# Patient Record
Sex: Male | Born: 1995 | Race: White | Hispanic: No | Marital: Single | Smoking: Never smoker
Health system: Southern US, Community
[De-identification: ages and names within clinical notes are randomized; demographics above are authoritative.]

## PROBLEM LIST (undated history)

## (undated) DIAGNOSIS — G43909 Migraine, unspecified, not intractable, without status migrainosus: Secondary | ICD-10-CM

---

## 2020-12-06 ENCOUNTER — Telehealth: Payer: Self-pay | Admitting: Hematology

## 2020-12-06 ENCOUNTER — Emergency Department (HOSPITAL_COMMUNITY): Payer: BC Managed Care – PPO

## 2020-12-06 ENCOUNTER — Encounter (HOSPITAL_COMMUNITY): Payer: Self-pay

## 2020-12-06 ENCOUNTER — Other Ambulatory Visit (HOSPITAL_COMMUNITY): Payer: Self-pay

## 2020-12-06 ENCOUNTER — Other Ambulatory Visit: Payer: Self-pay

## 2020-12-06 ENCOUNTER — Emergency Department (HOSPITAL_COMMUNITY)
Admission: EM | Admit: 2020-12-06 | Discharge: 2020-12-06 | Disposition: A | Discharge status: Home / Self Care | Payer: BC Managed Care – PPO | Attending: Emergency Medicine | Admitting: Emergency Medicine

## 2020-12-06 DIAGNOSIS — I676 Nonpyogenic thrombosis of intracranial venous system: Secondary | ICD-10-CM | POA: Diagnosis not present

## 2020-12-06 DIAGNOSIS — R519 Headache, unspecified: Secondary | ICD-10-CM

## 2020-12-06 DIAGNOSIS — G08 Intracranial and intraspinal phlebitis and thrombophlebitis: Secondary | ICD-10-CM

## 2020-12-06 HISTORY — DX: Migraine, unspecified, not intractable, without status migrainosus: G43.909

## 2020-12-06 LAB — CBC WITH DIFFERENTIAL/PLATELET
Abs Immature Granulocytes: 0.02 10*3/uL (ref 0.00–0.07)
Basophils Absolute: 0 10*3/uL (ref 0.0–0.1)
Basophils Relative: 0 %
Eosinophils Absolute: 0.1 10*3/uL (ref 0.0–0.5)
Eosinophils Relative: 1 %
HCT: 44.6 % (ref 39.0–52.0)
Hemoglobin: 15.4 g/dL (ref 13.0–17.0)
Immature Granulocytes: 0 %
Lymphocytes Relative: 32 %
Lymphs Abs: 2.3 10*3/uL (ref 0.7–4.0)
MCH: 28.3 pg (ref 26.0–34.0)
MCHC: 34.5 g/dL (ref 30.0–36.0)
MCV: 82 fL (ref 80.0–100.0)
Monocytes Absolute: 0.5 10*3/uL (ref 0.1–1.0)
Monocytes Relative: 7 %
Neutro Abs: 4.4 10*3/uL (ref 1.7–7.7)
Neutrophils Relative %: 60 %
Platelets: 294 10*3/uL (ref 150–400)
RBC: 5.44 MIL/uL (ref 4.22–5.81)
RDW: 13 % (ref 11.5–15.5)
WBC: 7.2 10*3/uL (ref 4.0–10.5)
nRBC: 0 % (ref 0.0–0.2)

## 2020-12-06 LAB — ANTITHROMBIN III: AntiThromb III Func: 123 % — ABNORMAL HIGH (ref 75–120)

## 2020-12-06 LAB — BASIC METABOLIC PANEL
Anion gap: 11 (ref 5–15)
BUN: 9 mg/dL (ref 6–20)
CO2: 23 mmol/L (ref 22–32)
Calcium: 9.9 mg/dL (ref 8.9–10.3)
Chloride: 105 mmol/L (ref 98–111)
Creatinine, Ser: 0.84 mg/dL (ref 0.61–1.24)
GFR, Estimated: >60 mL/min (ref >60)
Glucose, Bld: 107 mg/dL — ABNORMAL HIGH (ref 70–99)
Potassium: 3.7 mmol/L (ref 3.5–5.1)
Sodium: 139 mmol/L (ref 135–145)

## 2020-12-06 MED ORDER — RIVAROXABAN 20 MG PO TABS: 20.0000 mg | ORAL_TABLET | Freq: Every day | ORAL | 1 refills | Status: DC

## 2020-12-06 MED ORDER — METOCLOPRAMIDE HCL 5 MG/ML IJ SOLN
10.0000 mg | Freq: Once | INTRAMUSCULAR | Status: AC
  Administered 2020-12-06: 10 mg via INTRAVENOUS
  Filled 2020-12-06: qty 2

## 2020-12-06 MED ORDER — DEXAMETHASONE SODIUM PHOSPHATE 10 MG/ML IJ SOLN
10.0000 mg | Freq: Once | INTRAMUSCULAR | Status: AC
  Administered 2020-12-06: 10 mg via INTRAVENOUS
  Filled 2020-12-06: qty 1

## 2020-12-06 MED ORDER — ONDANSETRON HCL 4 MG/2ML IJ SOLN
4.0000 mg | Freq: Once | INTRAMUSCULAR | Status: AC
Start: 2020-12-06 — End: 2020-12-06
  Administered 2020-12-06: 4 mg via INTRAVENOUS
  Filled 2020-12-06: qty 2

## 2020-12-06 MED ORDER — GADOBUTROL 1 MMOL/ML IV SOLN
7.5000 mL | Freq: Once | INTRAVENOUS | Status: AC | PRN
  Administered 2020-12-06: 7.5 mL via INTRAVENOUS

## 2020-12-06 MED ORDER — IOHEXOL 350 MG/ML SOLN
75.0000 mL | Freq: Once | INTRAVENOUS | Status: AC | PRN
  Administered 2020-12-06: 75 mL via INTRAVENOUS

## 2020-12-06 MED ORDER — SODIUM CHLORIDE 0.9 % IV BOLUS
500.0000 mL | Freq: Once | INTRAVENOUS | Status: AC
  Administered 2020-12-06: 500 mL via INTRAVENOUS

## 2020-12-06 MED ORDER — KETOROLAC TROMETHAMINE 30 MG/ML IJ SOLN
30.0000 mg | Freq: Once | INTRAMUSCULAR | Status: AC
  Administered 2020-12-06: 30 mg via INTRAVENOUS
  Filled 2020-12-06: qty 1

## 2020-12-06 MED ORDER — RIVAROXABAN (XARELTO) VTE STARTER PACK (15 & 20 MG)
ORAL_TABLET | ORAL | 0 refills | Status: DC
  Filled 2020-12-06: qty 51, 30d supply, fill #0

## 2020-12-06 NOTE — ED Provider Notes (Signed)
[facility] EMERGENCY DEPARTMENT Provider Note   CSN: [id number] Arrival date & time: 12/06/20  0019     History Chief Complaint  Patient presents with  . Migraine    Jeremy Mcmahon is a 25 y.o. male.  He is here with a complaint of headaches that have been going on for 2 months.  They occur daily.  Associated with nausea vomiting dizziness lightheadedness.  Radiate into neck.  Currently 7 out of 10 intensity.  Occurs all over the head but primarily left parietal.  No blurry vision double vision numbness or weakness.  Is taking Ubrevly without much improvement.  Is awaiting neurology appointment in July.  The history is provided by the patient.  Migraine This is a recurrent problem. The current episode started yesterday. The problem occurs constantly. The problem has not changed since onset.Associated symptoms include headaches. Pertinent negatives include no chest pain, no abdominal pain and no shortness of breath. Nothing aggravates the symptoms. Nothing relieves the symptoms. He has tried rest for the symptoms. The treatment provided no relief.       Past Medical History:  Diagnosis Date  . Migraines     There are no problems to display for this patient.   History reviewed. No pertinent surgical history.     History reviewed. No pertinent family history.  Social History   Tobacco Use  . Smoking status: Never Smoker  . Smokeless tobacco: Never Used  Vaping Use  . Vaping Use: Never used  Substance Use Topics  . Alcohol use: Yes  . Drug use: Yes    Types: Marijuana    Home Medications Prior to Admission medications   Not on File    Allergies    Patient has no known allergies.  Review of Systems   Review of Systems  Constitutional: Negative for fever.  HENT: Negative for sore throat.   Eyes: Negative for visual disturbance.  Respiratory: Negative for shortness of breath.   Cardiovascular: Negative for chest pain.  Gastrointestinal:  Negative for abdominal pain.  Genitourinary: Negative for dysuria.  Musculoskeletal: Positive for neck pain.  Skin: Negative for rash.  Neurological: Positive for dizziness, light-headedness and headaches. Negative for syncope, speech difficulty, weakness and numbness.    Physical Exam Updated Vital Signs BP 118/83 (BP Location: Right Arm)   Pulse 87   Temp 99 F (37.2 C) (Oral)   Resp 18   SpO2 98%   Physical Exam Vitals and nursing note reviewed.  Constitutional:      Appearance: Normal appearance. He is well-developed.  HENT:     Head: Normocephalic and atraumatic.  Eyes:     Conjunctiva/sclera: Conjunctivae normal.  Cardiovascular:     Rate and Rhythm: Normal rate and regular rhythm.     Heart sounds: No murmur heard.   Pulmonary:     Effort: Pulmonary effort is normal. No respiratory distress.     Breath sounds: Normal breath sounds.  Abdominal:     Palpations: Abdomen is soft.     Tenderness: There is no abdominal tenderness.  Musculoskeletal:        General: No deformity or signs of injury. Normal range of motion.     Cervical back: Neck supple.  Skin:    General: Skin is warm and dry.  Neurological:     General: No focal deficit present.     Mental Status: He is alert and oriented to person, place, and time.     Cranial Nerves: No cranial nerve deficit.  Sensory: No sensory deficit.     Motor: No weakness.     Gait: Gait normal.     ED Results / Procedures / Treatments   Labs (all labs ordered are listed, but only abnormal results are displayed) Labs Reviewed  BASIC METABOLIC PANEL - Abnormal; Notable for the following components:      Result Value   Glucose, Bld 107 (*)    All other components within normal limits  ANTITHROMBIN III - Abnormal; Notable for the following components:   AntiThromb III Func 123 (*)    All other components within normal limits  CBC WITH DIFFERENTIAL/PLATELET  PROTEIN C ACTIVITY  PROTEIN C, TOTAL  PROTEIN S  ACTIVITY  PROTEIN S, TOTAL  LUPUS ANTICOAGULANT PANEL  BETA-2-GLYCOPROTEIN I ABS, IGG/M/A  HOMOCYSTEINE  FACTOR 5 LEIDEN  PROTHROMBIN GENE MUTATION  CARDIOLIPIN ANTIBODIES, IGG, IGM, IGA    EKG None  Radiology CT Head Wo Contrast  Result Date: 12/06/2020 CLINICAL DATA:  Headache EXAM: CT HEAD WITHOUT CONTRAST TECHNIQUE: Contiguous axial images were obtained from the base of the skull through the vertex without intravenous contrast. COMPARISON:  None. FINDINGS: Brain: Normal anatomic configuration. No abnormal intra or extra-axial mass lesion or fluid collection. No abnormal mass effect or midline shift. No evidence of acute intracranial hemorrhage or infarct. Ventricular size is normal. Cerebellum unremarkable. Vascular: Unremarkable Skull: Intact Sinuses/Orbits: Paranasal sinuses are clear. Orbits are unremarkable. Other: Mastoid air cells and middle ear cavities are clear. IMPRESSION: No acute intracranial abnormality.  Normal examination. Electronically Signed   By: Helyn Numbers MD   On: 12/06/2020 01:43   MR Brain W and Wo Contrast  Result Date: 12/06/2020 CLINICAL DATA:  Headache EXAM: MRI HEAD WITHOUT AND WITH CONTRAST TECHNIQUE: Multiplanar, multiecho pulse sequences of the brain and surrounding structures were obtained without and with intravenous contrast. CONTRAST:  7.75mL GADAVIST GADOBUTROL 1 MMOL/ML IV SOLN COMPARISON:  None. FINDINGS: Brain: There is no acute infarction or intracranial hemorrhage. There is no intracranial mass, mass effect, or edema. There is no hydrocephalus or extra-axial fluid collection. Ventricles and sulci are normal in size and configuration. No abnormal enhancement. Vascular: Major vessel flow voids at the skull base are preserved. Skull and upper cervical spine: Normal marrow signal is preserved. Sinuses/Orbits: Minor mucosal thickening.  Orbits are unremarkable. Other: Sella is unremarkable.  Mastoid air cells are clear. IMPRESSION: No intracranial mass,  abnormal enhancement, or other significant abnormality. Electronically Signed   By: Guadlupe Spanish M.D.   On: 12/06/2020 11:35   CT VENOGRAM HEAD  Result Date: 12/06/2020 CLINICAL DATA:  Dural venous sinus thrombosis suspected. EXAM: CT VENOGRAM HEAD TECHNIQUE: Contiguous axial images were obtained from the base of the skull through the vertex during the bolus administration of intravenous contrast using CTV timing. Multiplanar CT image reconstructions and MIPs were obtained to evaluate the venous anatomy. CONTRAST:  47mL OMNIPAQUE IOHEXOL 350 MG/ML SOLN COMPARISON:  Same day CT head and MRI FINDINGS: Linear, nonocclusive filling defects within the superior sagittal sinus. The superior sagittal sinus is not expanded. No evidence of thrombosis in the transverse, sigmoid, and straight sinuses. No evidence of deep vein thrombosis. Symmetric opacification of the cavernous sinuses. Please see same day MRI and CT head for extravascular intracranial evaluation. 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 Findings discussed with Dr. Charm Barges via telephone at 12:18 PM via telephone. Electronically Signed   By: Feliberto Harts MD   On: 12/06/2020 12:25  Procedures Procedures   Medications Ordered in ED Medications  sodium chloride 0.9 % bolus 500 mL (0 mLs Intravenous Stopped 12/06/20 1014)  ketorolac (TORADOL) 30 MG/ML injection 30 mg (30 mg Intravenous Given 12/06/20 0827)  metoCLOPramide (REGLAN) injection 10 mg (10 mg Intravenous Given 12/06/20 0825)  dexamethasone (DECADRON) injection 10 mg (10 mg Intravenous Given 12/06/20 0826)  ondansetron (ZOFRAN) injection 4 mg (4 mg Intravenous Given 12/06/20 1031)  gadobutrol (GADAVIST) 1 MMOL/ML injection 7.5 mL (7.5 mLs Intravenous Contrast Given 12/06/20 1115)  iohexol (OMNIPAQUE) 350 MG/ML injection 75 mL (75 mLs Intravenous Contrast Given 12/06/20 1155)    ED Course  I have  reviewed the triage vital signs and the nursing notes.  Pertinent labs & imaging results that were available during my care of the patient were reviewed by me and considered in my medical decision making (see chart for details).  Clinical Course as of 12/06/20 2045  Wed Dec 06, 2020  1022 Patient felt somewhat better after migraine cocktail but now headache is returned after ambulating.  Reviewed case with Dr. Selina Cooley neurology who recommends a CTV and MRI with and without.  Patient agreeable to plan. [MB]  1151 Patient was seen by Dr. Selina Cooley and she is recommending if the patient's CTV is negative that he stop his current migraine medications and start on Nurtec 75 q. OD and add topiramate 50 nightly if not improved after 1 week. [MB]  1220 Discussed with Dr. Candise Che -he felt that the patient could appropriately just be started on a DOAC.  He did agree that he should have a hyper coag work-up due to this unprovoked event.  He felt that he can follow-up in the oncology clinic if needed. [MB]    Clinical Course User Index [MB] Terrilee Files, MD   MDM Rules/Calculators/A&P                         This patient complains of daily headache x2 months; this involves an extensive number of treatment Options and is a complaint that carries with it a high risk of complications and Morbidity. The differential includes migraine, tension headache, tumor, bleed, mass, CVT  I ordered, reviewed and interpreted labs, which included CBC with normal white count normal hemoglobin, chemistries normal other than mildly elevated glucose, hypercoagulable work-up I ordered medication IV fluids, Toradol, Reglan, Decadron with slight improvement in his symptoms I ordered imaging studies which included CT head, CTV and MRI with and without and I independently    visualized and interpreted imaging which showed probable CVT nonocclusive Additional history obtained from patient's wife Previous records obtained and reviewed  in epic, no recent admissions I consulted Dr. Selina Cooley neurology and Dr. Candise Che hematology and discussed lab and imaging findings  Critical Interventions: None  After the interventions stated above, I reevaluated the patient and found patient still to be minimally symptomatic.  I have explained anticipated course and treatment.  He is agreeable to start on anticoagulation.  We will put outpatient referrals for hematology and neurology.  Return instructions discussed   Final Clinical Impression(s) / ED Diagnoses Final diagnoses:  Worsening headaches  Cerebral venous thrombosis    Rx / DC Orders ED Discharge Orders         Ordered    Ambulatory referral to Neurology       Comments: An appointment is requested in approximately: 4 weeks   12/06/20 1151    Ambulatory referral to Hematology / Oncology  12/06/20 1435    Ambulatory referral to Hematology / Oncology       Comments: Cerebral venous thrombosis - discussed with Dr Candise CheKale   12/06/20 1513    RIVAROXABAN (XARELTO) VTE STARTER PACK (15 & 20 MG)        12/06/20 1517    rivaroxaban (XARELTO) 20 MG TABS tablet  Daily with supper,   Status:  Discontinued        12/06/20 1519    rivaroxaban (XARELTO) 20 MG TABS tablet  Daily with supper        12/06/20 1527           Terrilee FilesButler, Lavonne C, MD 12/06/20 2048

## 2020-12-06 NOTE — ED Provider Notes (Signed)
  Emergency Medicine Provider in Triage Note   MSE was initiated and I personally evaluated the patient  1:11 AM on December 06, 2020 as provider in triage.   Chief Complaint: Headache  HPI  Patient is a 25 y.o. who presets to the ED with complaints of headache. Intermittent headache and neck pain x 2 months, almost daily, associated with nausea, vomiting, & dizziness. Gradual onset, steady progression. Discomfort worse tonight prompting ED visit. Has seen neurology. Taking as needed migraine medicine without relief. Would like some type of imaging as he has not had any.    Review of Systems  Positive: Headache, dizzy, nausea, vomiting Negative: Visual disturbance, numbness, or weakness.   Physical Exam  BP 128/83   Pulse 92   Temp 99 F (37.2 C) (Oral)   Resp 16   SpO2 100%    Gen:   Awake, no distress   HEENT:  Atraumatic  Resp:  Normal effort  Cardiac:  Normal rate  Abd:   Nondistended, nontender  MSK:   Moves extremities without difficulty  Neuro:  Speech clear, CN III-XII Grossly intact. Sensation grossly intact x 4. 5/5 symmetric grip strength & plantar/dorsiflexion strength. Intact finger to nose. Negative pronator drift.  Medical Decision Making   Initiation of care has begun. The patient has been counseled on the process, plan, and necessity for staying for the completion/evaluation, informed that the remainder of the evaluation will be completed by another provider, this initial triage assessment does not replace that evaluation, and the importance of remaining in the ED until their evaluation is complete.   Clinical Impression  Headache        Cherly Anderson, PA-C 12/06/20 7026    Shon Baton, MD 12/06/20 [phone]

## 2020-12-06 NOTE — ED Notes (Signed)
Pt was given scan images from CT & MRI on a disk at his request.

## 2020-12-06 NOTE — ED Triage Notes (Signed)
Pt c/o migraine with vomiting for several months. Pt states he is unable to function due to the pain and vomiting.

## 2020-12-06 NOTE — ED Notes (Signed)
Patient transported to MRI 

## 2020-12-06 NOTE — Discharge Instructions (Addendum)
You were seen in the emergency department for a headache.  You had a CAT scan of your head and an MRI.  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  The treatment for this is blood thinners.  We are putting referrals in for outpatient follow-up with our oncology and neurology groups.  Please return to the emergency department if any acute neurologic changes or uncontrolled bleeding.  Information on my medicine - XARELTO (rivaroxaban)  WHY WAS XARELTO PRESCRIBED FOR YOU? Xarelto was prescribed to treat blood clots that may have been found in the veins of your legs (deep vein thrombosis) or in your lungs (pulmonary embolism) and to reduce the risk of them occurring again.  What do you need to know about Xarelto? The starting dose is one 15 mg tablet taken TWICE daily with food for the FIRST 21 DAYS then the dose is changed to one 20 mg tablet taken ONCE A DAY with your evening meal.   DO NOT stop taking Xarelto without talking to the health care provider who prescribed the medication.  Refill your prescription for 20 mg tablets before you run out.  After discharge, you should have regular check-up appointments with your healthcare provider that is prescribing your Xarelto.  In the future your dose may need to be changed if your kidney function changes by a significant amount.  What do you do if you miss a dose? If you are taking Xarelto TWICE DAILY and you miss a dose, take it as soon as you remember. You may take two 15 mg tablets (total 30 mg) at the same time then resume your regularly scheduled 15 mg twice daily the next day.  If you are taking Xarelto ONCE DAILY and you miss a dose, take it as soon as you remember on the same day then continue your regularly scheduled once daily regimen the next day. Do not take two doses of Xarelto at the same time.   Important Safety Information Xarelto is a blood thinner medicine that can  cause bleeding. You should call your healthcare provider right away if you experience any of the following: ? Bleeding from an injury or your nose that does not stop. ? Unusual colored urine (red or dark brown) or unusual colored stools (red or black). ? Unusual bruising for unknown reasons. ? A serious fall or if you hit your head (even if there is no bleeding).  Some medicines may interact with Xarelto and might increase your risk of bleeding while on Xarelto. To help avoid this, consult your healthcare provider or pharmacist prior to using any new prescription or non-prescription medications, including herbals, vitamins, non-steroidal anti-inflammatory drugs (NSAIDs) and supplements.  This website has more information on Xarelto: VisitDestination.com.br.

## 2020-12-06 NOTE — Telephone Encounter (Signed)
Received a new hem referral from the [facility] ED for Cerebral venous thrombosis. Mr. Fouch has been scheduled to see Dr. Candise Che on 5/12 at 1pm. Pt is currently in the hospital. Letter mailed.

## 2020-12-07 LAB — LUPUS ANTICOAGULANT PANEL
DRVVT: 33.6 s (ref 0.0–47.0)
PTT Lupus Anticoagulant: 38 s (ref 0.0–51.9)

## 2020-12-07 LAB — BETA-2-GLYCOPROTEIN I ABS, IGG/M/A
Beta-2 Glyco I IgG: <9 GPI IgG units (ref 0–20)
Beta-2-Glycoprotein I IgA: <9 GPI IgA units (ref 0–25)
Beta-2-Glycoprotein I IgM: <9 GPI IgM units (ref 0–32)

## 2020-12-07 LAB — CARDIOLIPIN ANTIBODIES, IGG, IGM, IGA
Anticardiolipin IgA: <9 APL U/mL (ref 0–11)
Anticardiolipin IgG: <9 GPL U/mL (ref 0–14)
Anticardiolipin IgM: 13 MPL U/mL — ABNORMAL HIGH (ref 0–12)

## 2020-12-07 LAB — PROTEIN C, TOTAL: Protein C, Total: 135 % (ref 60–150)

## 2020-12-07 LAB — PROTEIN S ACTIVITY: Protein S Activity: 61 % — ABNORMAL LOW (ref 63–140)

## 2020-12-07 LAB — PROTEIN C ACTIVITY: Protein C Activity: 127 % (ref 73–180)

## 2020-12-07 LAB — PROTEIN S, TOTAL: Protein S Ag, Total: 72 % (ref 60–150)

## 2020-12-11 ENCOUNTER — Telehealth (HOSPITAL_COMMUNITY): Payer: Self-pay | Admitting: Pharmacist

## 2020-12-11 ENCOUNTER — Telehealth: Payer: Self-pay | Admitting: Hematology

## 2020-12-11 NOTE — Telephone Encounter (Signed)
Pharmacy Transitions of Care Follow-up Telephone Call  Date of discharge: 12/06/2020  How have you been since you were released from the hospital? Good  Medication changes made at discharge:  - START: Xarelto  Medication changes verified by the patient? Yes   Medication Accessibility:  Was the patient provided with refills on discharged medications? NO  Have all prescriptions been transferred from [facility] to home pharmacy? N/A  . Is the patient able to afford medications? Yes    Medication Review:  RIVAROXABAN (XARELTO)  Rivaroxaban 15 mg BID initiated on 12/06/20 Will switch to 20 mg daily after 21 days.   - Discussed importance of taking medication with food and around the same time everyday  - Reviewed potential DDIs with patient  - Advised patient of medications to avoid (NSAIDs, ASA)  - Educated that Tylenol (acetaminophen) will be the preferred analgesic to prevent risk of bleeding  - Emphasized importance of monitoring for signs and symptoms of bleeding (abnormal bruising, prolonged bleeding, nose bleeds, bleeding from gums, discolored urine, black tarry stools)  - Advised patient to alert all providers of anticoagulation therapy prior to starting a new medication or having a procedure    Follow-up Appointments:   [facility]t [facility]? Hematology/oncology Scheduled to see 12/14/20, Neurology mid-june  If their condition worsens, is the pt aware to call PCP or go to the Emergency Dept.? Yes  Final Patient Assessment: Reviewed possible side effects associated with Xarelto.  Patient states he is feeling good but has noticed some numbness in arm, he is calling neurology back today to speak with them about this concern.

## 2020-12-11 NOTE — Telephone Encounter (Signed)
Pt cld to reschedule his new pt appt w/Dr. Candise Che to 5/5 at 11am. Awar to arrive 20 minutes early.

## 2020-12-12 LAB — FACTOR 5 LEIDEN

## 2020-12-13 LAB — PROTHROMBIN GENE MUTATION

## 2020-12-13 NOTE — Progress Notes (Signed)
HEMATOLOGY/ONCOLOGY CONSULTATION NOTE  Date of Service: 12/14/2020  Patient Care Team: Normajean Glasgow, FNP as PCP - General (Family Medicine)  CHIEF COMPLAINTS/PURPOSE OF CONSULTATION:  Cerebral Venous Thrombosis  HISTORY OF PRESENTING ILLNESS:   Jeremy Mcmahon is a wonderful 25 y.o. male who has been referred to [place] by [facility] ED for evaluation and management of cerebral venous thrombosis. The pt reports that he is doing well overall. We are joined today by the pt's wife.  The pt presented in the ED on 12/06/2020 with complaints of daily migraines that have been ongoing for two months and associated nausea, vomiting, dizziness, and lightheadedness. The pt had a CT Head wo contrast that revealed "No acute intracranial abnormality.  Normal examination" . The pt also had a CT venogram head that revealed "Mild, linear, nonocclusive filling defects within a non-expanded superior sagittal sinus, possibly chronic thrombus." The pt had a MRI Brain that revealed  "No intracranial mass, abnormal enhancement, or other significant abnormality." The pt was referred here and to neurology. The pt's appointment with neurology is scheduled in one month, for 01/23/2021.  The pt notes that he has never seen a neurologist in the past for his hx of migraines. The pt notes that he has been very healthy in the past with no hospitalizations, surgeries, or chronic medical issues. He denies any allergies or chronic use of medicine. The pt notes that he has been cycling through different antidepressants for around 2-3 months. The pt notes he previously tried Lexapro, Cirtraline, and Prolex. The pt is currently on   The pt notes that a few months ago he was first experiencing chronic neck pains in the left side. The pt was seen for this and received Naproxen. This resolved until the pt started experiencing chronic headaches that were on both sides of his head and concentrated on the back left side. The pt notes that sometimes  it felt as though he had a tight band on his head. The pt notes the headaches started after antidepressants, but had been on these medicines before with no issues. The pt notes he went to his PCP and they gave him Ubrelvy for one week. The pt notes this helped him, but he was instructed to stop by the neurologist in the ED. The pt notes he went to the ED that night due to the migraines causing nausea and uncontrollable shaking. The pt notes they were not concerned with seizures and the doctor told the pt that the pt's brain was trying to tell him something. The pt's wife notes some issues related to memory loss, mood swings, and paranoia. The pt notes he experiences intermittent issues with his right hand losing all sensation and movement. He notes this is an extreme weakness from the wrist up that lasts a few minutes prior to coming back. The pt notes no history of blood clots and no familial history. The pt notes his mom's first cousin recently passed away of a blood clot issue. The pt denies any close-knit family with issues related to blood clots.  The pt notes that recently he has also been experiencing dizziness and a feeling of imbalance with quick and sudden movements. The pt notes his head feels like a "fish bowl" and "wishy washy". The pt notes he jogged the other day across his house and felt that after he needed to sit down the rest of the day.  The pt notes that he experienced head trauma and syncope in the 4th grade due  to a fall on the playground. The pt notes that he had a workup done for this and CT and noted no issues related to this event.  The pt notes he currently sees Dr. Ludger Nuttingevon, a psychologist in [place] for his depression. 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   Lab results 12/06/2020 of CBC w/diff and BMP is as follows: all values are WNL except  for Glucose of 107. 12/06/2020 Anticardiolipin IgG of <9, IgM of 13, IgA of <9. 12/06/2020 Prothrombin gene mutation not detected. 12/06/2020 Factor 5 leiden not detected. 12/06/2020 Beta-2 Glyco IgG / IgA / IgM of <9. 12/06/2020 Lupus Anticoagulant not detected. 12/06/2020 Protein S Total of 72, Activity of 61. 12/06/2020 Protein C Total of 135, Activity of 127. 12/06/2020 AntiThromb III Func of 123.  On review of systems, pt reports migraines, depression, decreased appetite, neck pain and denies fevers, chills, night sweats, sudden weight loss, leg swelling, changes in bowel habits, bug bites, severe dehydration, exotic travels, and any other symptoms.  MEDICAL HISTORY:  Past Medical History:  Diagnosis Date  . Migraines     SURGICAL HISTORY: No past surgical history on file.  SOCIAL HISTORY: Social History   Socioeconomic History  . Marital status: Single    Spouse name: Not on file  . Number of children: Not on file  . Years of education: Not on file  . Highest education level: Not on file  Occupational History  . Not on file  Tobacco Use  . Smoking status: Never Smoker  . Smokeless tobacco: Never Used  Vaping Use  . Vaping Use: Never used  Substance and Sexual Activity  . Alcohol use: Yes  . Drug use: Yes    Types: Marijuana  . Sexual activity: Not on file  Other Topics Concern  . Not on file  Social History Narrative  . Not on file   Social Determinants of Health   Financial Resource Strain: Not on file  Food Insecurity: Not on file  Transportation Needs: Not on file  Physical Activity: Not on file  Stress: Not on file  Social Connections: Not on file  Intimate Partner Violence: Not on file    FAMILY HISTORY: No family history on file.  ALLERGIES:  has No Known Allergies.  MEDICATIONS:  Current Outpatient Medications  Medication Sig Dispense Refill  . rivaroxaban (XARELTO) 20 MG TABS tablet Take 1 tablet (20 mg total) by mouth daily with supper.  30 tablet 1  . RIVAROXABAN (XARELTO) VTE STARTER PACK (15 & 20 MG) Follow package directions: Take one 15mg  tablet by mouth twice a day. On day 22, switch to one 20mg  tablet once a day. Take with food. 51 each 0   No current facility-administered medications for this visit.    REVIEW OF SYSTEMS:   10 Point review of Systems was done is negative except as noted above.  PHYSICAL EXAMINATION: ECOG PERFORMANCE STATUS: 1 - Symptomatic but completely ambulatory  . Vitals:   12/14/20 1127  BP: 116/72  Pulse: 62  Resp: 15  Temp: 97.6 F (36.4 C)  SpO2: 100%   Filed Weights   12/14/20 1127  Weight: 144 lb (65.3 kg)   .Body mass index is 19 kg/m.   GENERAL:alert, in no acute distress and comfortable SKIN: no acute rashes, no significant lesions EYES: conjunctiva are pink and non-injected, sclera anicteric  OROPHARYNX: MMM, no exudates, no oropharyngeal erythema or ulceration NECK: supple, no JVD LYMPH:  no palpable lymphadenopathy in the cervical, axillary or inguinal regions LUNGS: clear to auscultation b/l with normal respiratory effort HEART: regular rate & rhythm ABDOMEN:  normoactive bowel sounds , non tender, not distended. Extremity: no pedal edema PSYCH: alert & oriented x 3 with fluent speech NEURO: no focal motor/sensory deficits  LABORATORY DATA:  I have reviewed the data as listed  . CBC Latest Ref Rng & Units 12/06/2020  WBC 4.0 - 10.5 K/uL 7.2  Hemoglobin 13.0 - 17.0 g/dL 25.3  Hematocrit 66.4 - 52.0 % 44.6  Platelets 150 - 400 K/uL 294    . CMP Latest Ref Rng & Units 12/06/2020  Glucose 70 - 99 mg/dL 403(K)  BUN 6 - 20 mg/dL 9  Creatinine 7.42 - 5.95 mg/dL 6.38  Sodium 756 - 433 mmol/L 139  Potassium 3.5 - 5.1 mmol/L 3.7  Chloride 98 - 111 mmol/L 105  CO2 22 - 32 mmol/L 23  Calcium 8.9 - 10.3 mg/dL 9.9     RADIOGRAPHIC STUDIES: I have personally reviewed the radiological images as listed and agreed with the findings in the report. CT Head Wo  Contrast  Result Date: 12/06/2020 CLINICAL DATA:  Headache EXAM: CT HEAD WITHOUT CONTRAST TECHNIQUE: Contiguous axial images were obtained from the base of the skull through the vertex without intravenous contrast. COMPARISON:  None. FINDINGS: Brain: Normal anatomic configuration. No abnormal intra or extra-axial mass lesion or fluid collection. No abnormal mass effect or midline shift. No evidence of acute intracranial hemorrhage or infarct. Ventricular size is normal. Cerebellum unremarkable. Vascular: Unremarkable Skull: Intact Sinuses/Orbits: Paranasal sinuses are clear. Orbits are unremarkable. Other: Mastoid air cells and middle ear cavities are clear. IMPRESSION: No acute intracranial abnormality.  Normal examination. Electronically Signed   By: Helyn Numbers MD   On: 12/06/2020 01:43   MR Brain W and Wo Contrast  Result Date: 12/06/2020 CLINICAL DATA:  Headache EXAM: MRI HEAD WITHOUT AND WITH CONTRAST TECHNIQUE: Multiplanar, multiecho pulse sequences of the brain and surrounding structures were obtained without and with intravenous contrast. CONTRAST:  7.108mL GADAVIST GADOBUTROL 1 MMOL/ML IV SOLN COMPARISON:  None. FINDINGS: Brain: There is no acute infarction or intracranial hemorrhage. There is no intracranial mass, mass effect, or edema. There is no hydrocephalus or extra-axial fluid collection. Ventricles and sulci are normal in size and configuration. No abnormal enhancement. Vascular: Major vessel flow voids at the skull base are preserved. Skull and upper cervical spine: Normal marrow signal is preserved. Sinuses/Orbits: Minor mucosal thickening.  Orbits are unremarkable. Other: Sella is unremarkable.  Mastoid air cells are clear. IMPRESSION: No intracranial mass, abnormal enhancement, or other significant abnormality. Electronically Signed   By: Guadlupe Spanish M.D.   On: 12/06/2020 11:35   CT VENOGRAM HEAD  Result Date: 12/06/2020 CLINICAL DATA:  Dural venous sinus thrombosis suspected.  EXAM: CT VENOGRAM HEAD TECHNIQUE: Contiguous axial images were obtained from the base of the skull through the vertex during the bolus administration of intravenous contrast using CTV timing. Multiplanar CT image reconstructions and MIPs were obtained to evaluate the venous anatomy. CONTRAST:  53mL OMNIPAQUE IOHEXOL 350 MG/ML SOLN COMPARISON:  Same day CT head and MRI FINDINGS: Linear, nonocclusive filling defects within the superior sagittal sinus. The superior sagittal sinus is not expanded. No evidence of thrombosis in the transverse, sigmoid, and straight sinuses. No evidence of deep vein thrombosis. Symmetric opacification of the cavernous sinuses. Please see same day MRI  and CT head for extravascular intracranial evaluation. 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 Findings discussed with Dr. Charm Barges via telephone at 12:18 PM via telephone. 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 There were no signs of an acute clot. -Discussed pt labwork 12/06/2020-- no findings suggestive of an inherited nor acquired clotting disorder. Blood counts and chemistries normal. No protein S or C deficiencies.  -Advised pt that the borderline levels on labs that can slightly fluctuate and can be borderline if food intake irregular/ imbalanced. -Discussed common causes for cerebral clots-- genetic or acquired clotting disorders or trauma. The flow in blood vessels abnormal, cells abnormal, or the lining is abnormal. -Recommended pt f/u w neurologist regarding if this a blood clot on findings and if so, if this could explain the pt's symptoms. -Advised pt that it would be extremely rare and extremely unlikely that the COVID vaccine  would cause a clot or these symptoms.  -Recommended pt discuss his right hand loss of function and sensation with neurologist. -Discussed blood thinner management and duration. Will need neurologist opinion prior to decision. -Will re-evaluate indeterminate findings in 3 months with repeat labs. -Recommended pt f/u w PCP regarding need for pain medication and for short term disability from work. -Continue Xarelto for 6 months.and then ASA 81mg  po daily long term -Will see back in 3 months with labs 1 week prior.  FOLLOW UP: RTC with Dr in 3 months Labs 1 week prior to clinic visit   All of the patients questions were answered with apparent satisfaction. The patient knows to call the clinic with any problems, questions or concerns.  I spent 40 minutes counseling the patient face to face. The total time spent in the appointment was 60 minutes and more than 50% was on counseling and direct patient cares.    Candise Che MD MS AAHIVMS [facility] Hematology/Oncology Physician [facility]  (Office):       [phone] (Work cell):  [phone] (Fax):           [phone]  12/14/2020 12:38 PM  I, 02/13/2021, am acting as scribe for Dr. Minda Meo, MD.  .I have reviewed the above documentation for accuracy and completeness, and I agree with the above. Wyvonnia Lora MD

## 2020-12-14 ENCOUNTER — Inpatient Hospital Stay: Payer: BC Managed Care – PPO | Attending: Hematology | Admitting: Hematology

## 2020-12-14 ENCOUNTER — Other Ambulatory Visit: Payer: Self-pay

## 2020-12-14 VITALS — BP 116/72 | HR 62 | Temp 97.6°F | Resp 15 | Ht 73.0 in | Wt 144.0 lb

## 2020-12-14 DIAGNOSIS — Z7901 Long term (current) use of anticoagulants: Secondary | ICD-10-CM | POA: Diagnosis not present

## 2020-12-14 DIAGNOSIS — F32A Depression, unspecified: Secondary | ICD-10-CM | POA: Insufficient documentation

## 2020-12-14 DIAGNOSIS — I676 Nonpyogenic thrombosis of intracranial venous system: Secondary | ICD-10-CM | POA: Insufficient documentation

## 2020-12-14 DIAGNOSIS — R519 Headache, unspecified: Secondary | ICD-10-CM | POA: Diagnosis not present

## 2020-12-14 DIAGNOSIS — G08 Intracranial and intraspinal phlebitis and thrombophlebitis: Secondary | ICD-10-CM

## 2020-12-15 ENCOUNTER — Telehealth: Payer: Self-pay | Admitting: Hematology

## 2020-12-15 NOTE — Telephone Encounter (Signed)
Scheduled follow-up appointments per 5/5 los. Patient is aware. 

## 2020-12-20 ENCOUNTER — Encounter: Payer: Self-pay | Admitting: Neurology

## 2020-12-20 ENCOUNTER — Telehealth: Payer: Self-pay | Admitting: Neurology

## 2020-12-20 ENCOUNTER — Ambulatory Visit: Payer: BC Managed Care – PPO | Admitting: Neurology

## 2020-12-20 VITALS — BP 125/75 | HR 101 | Ht 73.0 in | Wt 146.0 lb

## 2020-12-20 DIAGNOSIS — G08 Intracranial and intraspinal phlebitis and thrombophlebitis: Secondary | ICD-10-CM

## 2020-12-20 DIAGNOSIS — G4486 Cervicogenic headache: Secondary | ICD-10-CM | POA: Diagnosis not present

## 2020-12-20 DIAGNOSIS — M542 Cervicalgia: Secondary | ICD-10-CM

## 2020-12-20 DIAGNOSIS — R519 Headache, unspecified: Secondary | ICD-10-CM

## 2020-12-20 MED ORDER — GABAPENTIN 100 MG PO CAPS: 100.0000 mg | ORAL_CAPSULE | Freq: Every day | ORAL | 3 refills | Status: AC

## 2020-12-20 MED ORDER — AMITRIPTYLINE HCL 25 MG PO TABS: 50.0000 mg | ORAL_TABLET | Freq: Every day | ORAL | 3 refills | Status: DC

## 2020-12-20 NOTE — Addendum Note (Signed)
Addended by: Huston Foley on: 12/20/2020 03:25 PM   Modules accepted: Orders

## 2020-12-20 NOTE — Patient Instructions (Addendum)
It was nice to meet you today.  Your neurological exam is normal thankfully.  Nevertheless, I have several suggestions, I would like to proceed with neck MRI with and without contrast since you have chronic neck pain for the past 3 months.  Please schedule an eye examination as you have not seen an eye specialist in over 2 years and you have prescription eyeglasses which may need updating.  Continue follow-up with hematology and ask them how long you should be on Xarelto, we can consider a repeat brain scan to look at the evolution of your venous blood clot in 3 months.  Your headaches may be a combination of tension headaches, migraine headaches, also neck related headaches.  For headache prevention I recommend Elavil (generic name: amitriptyline) 25 mg: Take half a pill daily at bedtime for one week, then one pill daily at bedtime for one week, then one and a half pills daily at bedtime for one week, then 2 pills daily at bedtime thereafter. Common side effects reported are: mouth dryness, drowsiness, confusion, dizziness.   Please try to hydrate well with water, 6 to 8 cups/day are generally recommended, 8 ounce size each.  Avoid excessive caffeine, you may limit yourself to 1 serving or 2 servings per day, avoid alcohol altogether and avoid illicit drugs including smoking marijuana.

## 2020-12-20 NOTE — Telephone Encounter (Signed)
I called the pt and left vm advising of this change and interaction. Pt was advised to CB if he had any questions.  Called Dasia and advised to D/c the amitriptyline med and fill the gabapentin 100 mg. She verbalized understanding and advised she recently spoke to the pt on this as well.

## 2020-12-20 NOTE — Telephone Encounter (Signed)
I returned Dasia from [facility] call. She sts a notice came through for this pt.  She sts the Wellbutrin 150 mg and the Amitriptyline have an interaction and has a risk for seizure activity. Wanted to know if med should be filled.   I advised I would check with Dr. Frances Furbish and CB, advised to put order on hold for now until I can confirm with the provider.

## 2020-12-20 NOTE — Telephone Encounter (Signed)
[facility] @[id number] - [place], [place] - [address] has called about a possible drug interaction between amitriptyline (ELAVIL) 25 MG tablet & buPROPion (WELLBUTRIN XL) 150 MG 24 hr tablet please call.

## 2020-12-20 NOTE — Progress Notes (Signed)
Subjective:    Patient ID: Jeremy Mcmahon is a 25 y.o. male.  HPI     Huston FoleySaima Pocahontas Cohenour, MD, PhD [facility] [address], [address] [address] [place], [place] [address]  Dear Camelia EngMicah,   I saw your patient, Jeremy Mcmahon, upon your kind request, in my Neurologic clinic today for initial consultation of his migraine headaches.  The patient is unaccompanied today.  As you know, Jeremy Mcmahon is a 25 year old right-handed gentleman with an underlying medical history of migraine headaches, anxiety, depression, recent diagnosis of probable cerebral venous thrombosis, on Xarelto, who reports a 6886-month history of recurrent headaches, headaches tend to be on the bitemporal areas or left parietal area, sometimes based on the neck and radiating upwards.  Sometimes the headache is throbbing.  He has had a longer standing history of neck pain of over 3 months duration.  He has not seen anybody for his neck pain but did go to urgent care in February.  He was given naproxen and was advised to do stretching exercises.  He has been tried on [place]rel[place] but when he went to the emergency room he was advised to stop it.  He has tried over-the-counter medication for headaches.  He has some associated nausea and vomiting, occasional light sensitivity, occasional sound sensitivity and also reports history of vertigo. He works as a Water quality scientistphlebotomist.  He is a non-smoker and does not drink alcohol, has had marijuana before but not in the past few months.  He drinks caffeine occasionally in the form of soda, not daily.  He tries to hydrate well.  He has no family history of migraines or clotting disorders with the exception of 1 first cousin of his mother, this cousin passed away from complications of a blood clot, he has no additional information.  I reviewed your office note from 12/01/2019.  He presented to the emergency room on 12/06/2020 with a 8886-month history of recurrent headaches.  He reported associated nausea and  vomiting as well as lightheadedness.  He was on Ubrelvy at the time without obvious improvement.  I reviewed the emergency room records.  He was treated symptomatically with dexamethasone, ketorolac, metoclopramide, ondansetron.  He had a CT head without contrast on 12/06/2020 and I reviewed the results:  IMPRESSION: No acute intracranial abnormality.  Normal examination.  He had a brain MRI with and without contrast on 12/06/2020 and I reviewed the results:  IMPRESSION: No intracranial mass, abnormal enhancement, or other significant abnormality.  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  Blood work included hypercoagulable labs, protein S activity with slightly decreased percentage at 61 with normal range at 63 to 140%.  Anticardiolipin IgM was elevated at about 13, Antithrombin III was 123% with normal range of 75 to 120%.  He reports loss of appetite in the past few months.  He has had some weight fluctuations, he is not sure what blood work additional blood work he has had through your office, no recent blood work was included in the office note from his visit from 11/30/2020.  For his anxiety and depression he has been on different medications in the past and recalls taking Lexapro, Prozac and sertraline.  For the past 2 weeks he has been on Wellbutrin, long-acting, 150 mg once daily.  He sees a Therapist, sportspsychiatrist.  His Past Medical History Is Significant For: Past Medical  History:  Diagnosis Date  . Migraines     His Past Surgical History Is Significant For: No past surgical history on file.  His Family History Is Significant For: No family history on file.  His Social History Is Significant For: Social History   Socioeconomic History  . Marital status: Single    Spouse name: Not on file  . Number of children: Not on file   . Years of education: Not on file  . Highest education level: Not on file  Occupational History  . Not on file  Tobacco Use  . Smoking status: Never Smoker  . Smokeless tobacco: Never Used  Vaping Use  . Vaping Use: Never used  Substance and Sexual Activity  . Alcohol use: Yes  . Drug use: Yes    Types: Marijuana  . Sexual activity: Not on file  Other Topics Concern  . Not on file  Social History Narrative  . Not on file   Social Determinants of Health   Financial Resource Strain: Not on file  Food Insecurity: Not on file  Transportation Needs: Not on file  Physical Activity: Not on file  Stress: Not on file  Social Connections: Not on file    His Allergies Are:  Allergies  Allergen Reactions  . Banana Nausea And Vomiting  :   His Current Medications Are:  Outpatient Encounter Medications as of 12/20/2020  Medication Sig  . Acetaminophen (TYLENOL 8 HOUR PO) Take by mouth.  Marland Kitchen buPROPion (WELLBUTRIN XL) 150 MG 24 hr tablet Take 1 tablet by mouth every morning.  . rivaroxaban (XARELTO) 20 MG TABS tablet Take 1 tablet (20 mg total) by mouth daily with supper.  . [DISCONTINUED] RIVAROXABAN (XARELTO) VTE STARTER PACK (15 & 20 MG) Follow package directions: Take one 15mg  tablet by mouth twice a day. On day 22, switch to one 20mg  tablet once a day. Take with food.   No facility-administered encounter medications on file as of 12/20/2020.  :   Review of Systems:  Out of a complete 14 point review of systems, all are reviewed and negative with the exception of these symptoms as listed below:  Review of Systems  Neurological:       Pt here for consult on worsening migraine h/a. Pt reports since February, increased neck pain and h/a. Pt reports back in April he had a severe h/a and had to go to the ED. Pt sts his h/a are debilitating at times and it is difficult for him to move.  Pt typically will treat with OTC meds and stinting still, reports any movement irritates his h/a.      Objective:  Neurological Exam  Physical Exam Physical Examination:   Vitals:   12/20/20 1303  BP: 125/75  Pulse: (!) 101   General Examination: The patient is a very pleasant 25 y.o. male in no acute distress. He appears well-developed and well-nourished and well groomed.  Mildly anxious appearing, no photophobia.  HEENT: Normocephalic, atraumatic, pupils are equal, round and reactive to light and accommodation. Funduscopic exam is normal with sharp disc margins noted. Extraocular tracking is good without limitation to gaze excursion or nystagmus noted. Normal smooth pursuit is noted. Hearing is grossly intact. Face is symmetric with normal facial animation and normal facial sensation. Speech is clear with no dysarthria noted. There is no hypophonia. There is no lip, neck/head, jaw or voice tremor. Neck is supple with full range of passive and active motion. There are no carotid bruits on auscultation. Oropharynx  exam reveals: mild mouth dryness, adequate dental hygiene.  Tongue protrudes centrally and palate elevates symmetrically.   Chest: Clear to auscultation without wheezing, rhonchi or crackles noted.  Heart: S1+S2+0, regular and normal without murmurs, rubs or gallops noted.   Abdomen: Soft, non-tender and non-distended with normal bowel sounds appreciated on auscultation.  Extremities: There is no pitting edema in the distal lower extremities bilaterally.  Skin: Warm and dry without trophic changes noted.  Musculoskeletal: exam reveals no obvious joint deformities, tenderness or joint swelling or erythema.   Neurologically:  Mental status: The patient is awake, alert and oriented in all 4 spheres. His immediate and remote memory, attention, language skills and fund of knowledge are appropriate. There is no evidence of aphasia, agnosia, apraxia or anomia. Speech is clear with normal prosody and enunciation. Thought process is linear. Mood is normal and affect is normal.   Cranial nerves II - XII are as described above under HEENT exam. In addition: shoulder shrug is normal with equal shoulder height noted. Motor exam: Normal bulk, strength and tone is noted. There is no drift, tremor or rebound. Romberg is negative. Reflexes are 2+ throughout. Babinski: Toes are flexor bilaterally. Fine motor skills and coordination: intact with normal finger taps, normal hand movements, normal rapid alternating patting, normal foot taps and normal foot agility.  Cerebellar testing: No dysmetria or intention tremor on finger to nose testing. Heel to shin is unremarkable bilaterally. There is no truncal or gait ataxia.  Sensory exam: intact to light touch, vibration, temperature sense in the upper and lower extremities.  Gait, station and balance: He stands easily. No veering to one side is noted. No leaning to one side is noted. Posture is age-appropriate and stance is narrow based. Gait shows normal stride length and normal pace. No problems turning are noted. Tandem walk is unremarkable.   Assessment and Plan:  In summary, Zymire Turnbo is a very pleasant 25 y.o.-year old male with an underlying medical history of migraine headaches, anxiety, depression, recent diagnosis of probable cerebral venous thrombosis, on Xarelto, who presents for evaluation of his recurrent headaches of about 2 months duration.  Headache description and presentation suggest the possibility of a multifactorial headache including stress and anxiety related tension headache migrainous headache as well as cervicogenic headache.  He is currently on Xarelto and has seen hematology.  He had some minor abnormalities on his hypercoagulable profile and is advised to touch base with hematology as to how long they recommend he should be on Xarelto and if he has a clotting disorder as such.  He has never had any other clots.  He is advised that his exam is benign, he is reassured in that regard.  He is advised to start a trial  of low-dose amitriptyline for headache prevention which can be helpful for tension headache as well as neuralgic headache, as well as migraine headaches.  He will start 25 mg strength half a pill with gradual titration.  He was given written instructions and we talked about potential side effects.  He is advised to seek a formal evaluation for his vision, he has not seen an eye doctor in over 2 years and has an older prescription.  He is advised to stay well-hydrated and avoid caffeine and avoid completely alcohol currently.  He is furthermore advised to avoid illicit drugs including marijuana.  We will consider a repeat MR venogram or CT venogram of his head at the next visit.  Given his chronic neck pain of  over 3 months duration we will proceed with a neck MRI with and without contrast and keep him posted by phone call as to the results.  He reports appetite loss in the past several months as well as weight fluctuations.  We talked about doing blood work in this clinic but he may have had previous blood work through [employer].  I recommend that he have vitamin D, vitamin B12 and thyroid function tested through your office and he is advised to follow-up with your office routinely.  He is advised to see one of our nurse practitioners in 3 months in this office.  I answered all his questions today and he was in agreement with the plan.  Thank you very much for allowing me to participate in the care of this nice patient. If I can be of any further assistance to you please do not hesitate to call me at [phone].  Sincerely,   Huston Foley, MD, PhD

## 2020-12-20 NOTE — Telephone Encounter (Addendum)
Please call patient and advise him that there is a potential interaction with amitriptyline and Wellbutrin.  Even though both medications are currently low dose and the interaction is dose-related, it is possible that with time he has to increase the Wellbutrin dose with his psychiatrist as he just started on it and we may have to increase the amitriptyline dose for efficacy.  Therefore, it is probably safest to not start the amitriptyline at this time.  I would like to start gabapentin 100 mg at bedtime for now.  Please call the pharmacy and asked them to cancel the amitriptyline prescription.  Please advise patient, the most common side effects with gabapentin reported are sedation or sleepiness. Rare side effects include balance problems, confusion.

## 2020-12-21 ENCOUNTER — Telehealth: Payer: Self-pay | Admitting: Neurology

## 2020-12-21 ENCOUNTER — Encounter: Payer: BC Managed Care – PPO | Admitting: Hematology

## 2020-12-21 NOTE — Telephone Encounter (Signed)
Scheduled with patient 12/27/20 at [facility] 45 mins MRI Cervical Spine w/wo contrast Dr. Alinda Sierras Berkley Harvey #[id number] exp. 12/21/20-01/19/21

## 2020-12-24 ENCOUNTER — Ambulatory Visit (HOSPITAL_COMMUNITY)
Admission: EM | Admit: 2020-12-24 | Discharge: 2020-12-24 | Disposition: A | Discharge status: Home / Self Care | Payer: BC Managed Care – PPO | Attending: Emergency Medicine | Admitting: Emergency Medicine

## 2020-12-24 ENCOUNTER — Other Ambulatory Visit: Payer: Self-pay

## 2020-12-24 ENCOUNTER — Encounter (HOSPITAL_COMMUNITY): Payer: Self-pay | Admitting: Emergency Medicine

## 2020-12-24 DIAGNOSIS — G43711 Chronic migraine without aura, intractable, with status migrainosus: Secondary | ICD-10-CM | POA: Diagnosis not present

## 2020-12-24 MED ORDER — TIZANIDINE HCL 4 MG PO TABS: 4.0000 mg | ORAL_TABLET | Freq: Three times a day (TID) | ORAL | 0 refills | Status: AC | PRN

## 2020-12-24 NOTE — ED Provider Notes (Addendum)
HPI  SUBJECTIVE:  Jeremy Mcmahon is a 25 y.o. male who reports a constant, daily, dull, pounding, throbbing waxing and waning headache that has been present since March.  He states that it is diffuse, sometimes feels like a tight band around his forehead, sometimes concentrates in the left posterior aspect of his head.  He reports nausea, memory loss recently.  He now reports photophobia, phonophobia.  He reports an episode of numbness in his bilateral hands that went up his arms last night.  No recent vomiting, fevers, unilateral arm or leg weakness, slurred speech, facial droop.  He states that the headache has not changed since it started several months ago, however it is not going away.  Symptoms are better with being in a quiet room, worse with turning his head and with walking.  He has tried [place], was started on Neurontin 100 mg daily 2 days ago and has also tried Tylenol.  He has had 2 negative CT and MRIs of his head, however it is thought he may have a cerebral venous thrombosis and is currently on Xarelto.  He has been seen by neurology and hematology oncology, hypercoagulable work-up was negative.  He is scheduled to get an MRI with and without contrast of his neck on Wednesday per neurology.  He has a past medical history of migraines, chronic neck pain.  No history of diabetes, seizures, hypertension, cancer.  NAT:[id number], Camelia Eng, FNP Neurology: Dr. Dannial Monarch   Past Medical History:  Diagnosis Date  . Migraines     History reviewed. No pertinent surgical history.  History reviewed. No pertinent family history.  Social History   Tobacco Use  . Smoking status: Never Smoker  . Smokeless tobacco: Never Used  Vaping Use  . Vaping Use: Never used  Substance Use Topics  . Alcohol use: Yes  . Drug use: Yes    Types: Marijuana    No current facility-administered medications for this encounter.  Current Outpatient Medications:  .  tiZANidine (ZANAFLEX) 4 MG tablet, Take 1 tablet (4  mg total) by mouth every 8 (eight) hours as needed for muscle spasms., Disp: 30 tablet, Rfl: 0 .  Acetaminophen (TYLENOL 8 HOUR PO), Take by mouth., Disp: , Rfl:  .  buPROPion (WELLBUTRIN XL) 150 MG 24 hr tablet, Take 1 tablet by mouth every morning., Disp: , Rfl:  .  gabapentin (NEURONTIN) 100 MG capsule, Take 1 capsule (100 mg total) by mouth at bedtime., Disp: 30 capsule, Rfl: 3 .  rivaroxaban (XARELTO) 20 MG TABS tablet, Take 1 tablet (20 mg total) by mouth daily with supper., Disp: 30 tablet, Rfl: 1 .  UBRELVY 100 MG TABS, PLEASE SEE ATTACHED FOR DETAILED DIRECTIONS, Disp: , Rfl:   Allergies  Allergen Reactions  . Banana Nausea And Vomiting     ROS  As noted in HPI.   Physical Exam  BP 122/78 (BP Location: Left Arm)   Pulse 79   Temp 99.2 F (37.3 C) (Oral)   Resp 17   SpO2 100%   Constitutional: Well developed, well nourished, no acute distress Eyes: PERRL, EOMI, conjunctiva normal bilaterally.  Mild photophobia HENT: Normocephalic, atraumatic,mucus membranes moist, normal dentition.  TM normal b/l.  No nasal congestion, no sinus tenderness. Neck: no cervical LN + bilateral trapezial muscle tenderness. No meningismus Respiratory: normal inspiratory effort Cardiovascular: Normal rate, regular rhythm GI:  nondistended skin: No rash, skin intact Musculoskeletal: Sensation in bilateral upper extremities intact and equal.  Grip strength 5/5 and equal bilaterally.  He is  moving his upper arms without any problem. Neurologic: Alert & oriented x 3, CN III-XII intact, romberg neg, finger-> nose, heel-> shin equal b/l, Romberg neg, tandem gait steady Psychiatric: Speech and behavior appropriate   ED Course   Medications - No data to display  No orders of the defined types were placed in this encounter.  No results found for this or any previous visit (from the past 24 hour(s)). No results found.   ED Clinical Impression  1. Intractable chronic migraine without aura and  with status migrainosus     ED Assessment/Plan  Patient with a chronic headache/migraine that is unchanged today.  His new issue is the bilateral hand numbness and tingling that he experienced last night.  He has had multiple recent imaging of the brain.  Doubt SAH, ICH or space occupying lesion. Pt without fevers/chills, Pt has no meningeal sx, no nuchal rigidity. Doubt meningitis. Pt with normal neuro exam, no evidence of CVA/TIA.  Pt BP not elevated significantly, doubt hypertensive emergency.  Did not repeat headache cocktail, given chronicity of symptoms and patient states that it did not help him when he got 1 in the ED.   He does have bilateral trapezial tenderness, so he may have a musculoskeletal component to this.  He has an MRI of his neck scheduled on Wednesday.  We will start him on Zanaflex in addition to having him continue Ubrelvy, Neurontin, and Tylenol 1000 mg 3-4 times a day as needed for pain.  I have reached out to his neurologist for next steps.  Discussed medical decision making, treatment plan and plan for follow-up with patient.  Discussed signs and symptoms that should prompt return to emergency department.  Patient agrees with plan.   Meds ordered this encounter  Medications  . tiZANidine (ZANAFLEX) 4 MG tablet    Sig: Take 1 tablet (4 mg total) by mouth every 8 (eight) hours as needed for muscle spasms.    Dispense:  30 tablet    Refill:  0    *This clinic note was created using Scientist, clinical (histocompatibility and immunogenetics). Therefore, there may be occasional mistakes despite careful proofreading.  ?    Domenick Gong, MD 12/24/20 2022    Domenick Gong, MD 12/24/20 2023

## 2020-12-24 NOTE — ED Triage Notes (Signed)
Pt presents with on going migraines. States this has been occurring since March. States having episodes of numbness and memory fogg.

## 2020-12-24 NOTE — Discharge Instructions (Addendum)
Take 1000 mg of Tylenol 3-4 times a day as needed for pain.  You can also try the Zanaflex.  Continue Bernita Raisin, Xarelto.  I have sent a note to your neurologist asking about next steps and expect that they will be reaching out to you soon.  I did mention the episode with your hands last night.  I hope that you start feeling better soon.

## 2020-12-27 ENCOUNTER — Ambulatory Visit (INDEPENDENT_AMBULATORY_CARE_PROVIDER_SITE_OTHER): Payer: BC Managed Care – PPO

## 2020-12-27 ENCOUNTER — Other Ambulatory Visit: Payer: Self-pay

## 2020-12-27 DIAGNOSIS — R519 Headache, unspecified: Secondary | ICD-10-CM

## 2020-12-27 DIAGNOSIS — G08 Intracranial and intraspinal phlebitis and thrombophlebitis: Secondary | ICD-10-CM | POA: Diagnosis not present

## 2020-12-27 DIAGNOSIS — G4486 Cervicogenic headache: Secondary | ICD-10-CM | POA: Diagnosis not present

## 2020-12-27 DIAGNOSIS — M542 Cervicalgia: Secondary | ICD-10-CM | POA: Diagnosis not present

## 2020-12-27 MED ORDER — GADOBENATE DIMEGLUMINE 529 MG/ML IV SOLN
15.0000 mL | Freq: Once | INTRAVENOUS | Status: AC | PRN
  Administered 2020-12-27: 15 mL via INTRAVENOUS

## 2020-12-28 ENCOUNTER — Telehealth: Payer: Self-pay

## 2020-12-28 NOTE — Progress Notes (Signed)
Please call patient and advise him that his neck MRI w/wo contrast was reported as normal.

## 2020-12-28 NOTE — Telephone Encounter (Signed)
I called the pt and advised of results via vm. Pt was advised to CB if he has any questions/concerns. Pt was also advised to keep August f/u.

## 2020-12-28 NOTE — Telephone Encounter (Signed)
-----   Message from Huston Foley, MD sent at 12/28/2020  4:15 PM EDT ----- Please call patient and advise him that his neck MRI w/wo contrast was reported as normal.

## 2021-01-23 ENCOUNTER — Ambulatory Visit: Payer: BC Managed Care – PPO | Admitting: Neurology

## 2021-01-29 ENCOUNTER — Other Ambulatory Visit (HOSPITAL_COMMUNITY): Payer: Self-pay

## 2021-01-30 ENCOUNTER — Telehealth: Payer: Self-pay

## 2021-01-30 ENCOUNTER — Other Ambulatory Visit: Payer: Self-pay

## 2021-01-30 DIAGNOSIS — G08 Intracranial and intraspinal phlebitis and thrombophlebitis: Secondary | ICD-10-CM

## 2021-01-30 MED ORDER — RIVAROXABAN 20 MG PO TABS: 20.0000 mg | ORAL_TABLET | Freq: Every day | ORAL | 2 refills | Status: DC

## 2021-01-30 NOTE — Telephone Encounter (Signed)
Returned call to pt regarding appointment and medication management. Gave pt appointment dates and times and encouraged pt to remain on blood thinner until he sees Dr Candise Che. We will refill pt's medication until his appointment where at that point Dr Candise Che can reassess. Pt verbalized understanding.

## 2021-02-19 ENCOUNTER — Ambulatory Visit: Payer: BC Managed Care – PPO | Admitting: Neurology

## 2021-03-09 ENCOUNTER — Other Ambulatory Visit: Payer: Self-pay

## 2021-03-09 ENCOUNTER — Inpatient Hospital Stay: Payer: BC Managed Care – PPO | Attending: Hematology

## 2021-03-09 DIAGNOSIS — G08 Intracranial and intraspinal phlebitis and thrombophlebitis: Secondary | ICD-10-CM | POA: Diagnosis present

## 2021-03-09 LAB — CBC WITH DIFFERENTIAL/PLATELET
Abs Immature Granulocytes: 0.01 10*3/uL (ref 0.00–0.07)
Basophils Absolute: 0 10*3/uL (ref 0.0–0.1)
Basophils Relative: 1 %
Eosinophils Absolute: 0.1 10*3/uL (ref 0.0–0.5)
Eosinophils Relative: 2 %
HCT: 39.1 % (ref 39.0–52.0)
Hemoglobin: 13.9 g/dL (ref 13.0–17.0)
Immature Granulocytes: 0 %
Lymphocytes Relative: 42 %
Lymphs Abs: 2.3 10*3/uL (ref 0.7–4.0)
MCH: 28.6 pg (ref 26.0–34.0)
MCHC: 35.5 g/dL (ref 30.0–36.0)
MCV: 80.5 fL (ref 80.0–100.0)
Monocytes Absolute: 0.4 10*3/uL (ref 0.1–1.0)
Monocytes Relative: 8 %
Neutro Abs: 2.6 10*3/uL (ref 1.7–7.7)
Neutrophils Relative %: 47 %
Platelets: 198 10*3/uL (ref 150–400)
RBC: 4.86 MIL/uL (ref 4.22–5.81)
RDW: 12.8 % (ref 11.5–15.5)
WBC: 5.5 10*3/uL (ref 4.0–10.5)
nRBC: 0 % (ref 0.0–0.2)

## 2021-03-09 LAB — CMP (CANCER CENTER ONLY)
ALT: 16 U/L (ref 0–44)
AST: 15 U/L (ref 15–41)
Albumin: 4.4 g/dL (ref 3.5–5.0)
Alkaline Phosphatase: 65 U/L (ref 38–126)
Anion gap: 6 (ref 5–15)
BUN: 11 mg/dL (ref 6–20)
CO2: 27 mmol/L (ref 22–32)
Calcium: 9.3 mg/dL (ref 8.9–10.3)
Chloride: 106 mmol/L (ref 98–111)
Creatinine: 0.95 mg/dL (ref 0.61–1.24)
GFR, Estimated: >60 mL/min (ref >60)
Glucose, Bld: 95 mg/dL (ref 70–99)
Potassium: 4.2 mmol/L (ref 3.5–5.1)
Sodium: 139 mmol/L (ref 135–145)
Total Bilirubin: 0.4 mg/dL (ref 0.3–1.2)
Total Protein: 7.2 g/dL (ref 6.5–8.1)

## 2021-03-10 LAB — PROTEIN S PANEL
Protein S Activity: 86 % (ref 63–140)
Protein S Ag, Free: 86 % (ref 61–136)
Protein S Ag, Total: 65 % (ref 60–150)

## 2021-03-10 LAB — CARDIOLIPIN ANTIBODIES, IGG, IGM, IGA
Anticardiolipin IgA: <9 APL U/mL (ref 0–11)
Anticardiolipin IgG: <9 GPL U/mL (ref 0–14)
Anticardiolipin IgM: 18 MPL U/mL — ABNORMAL HIGH (ref 0–12)

## 2021-03-15 NOTE — Progress Notes (Signed)
HEMATOLOGY/ONCOLOGY CONSULTATION NOTE  Date of Service: 03/15/2021  Patient Care Team: [name], Micah, FNP as PCP - General (Family Medicine)  CHIEF COMPLAINTS/PURPOSE OF CONSULTATION:  Cerebral Venous Thrombosis  HISTORY OF PRESENTING ILLNESS:   Jeremy Mcmahon is a wonderful 25 y.o. male who has been referred to us by [facility] for evaluation and management of cerebral venous thrombosis. The pt reports that he is doing well overall. We are joined today by the pt's wife.  The pt presented in the ED on 12/06/2020 with complaints of daily migraines that have been ongoing for two months and associated nausea, vomiting, dizziness, and lightheadedness. The pt had a CT Head wo contrast that revealed "No acute intracranial abnormality.  Normal examination" . The pt also had a CT venogram head that revealed "Mild, linear, nonocclusive filling defects within a non-expanded superior sagittal sinus, possibly chronic thrombus." The pt had a MRI Brain that revealed  "No intracranial mass, abnormal enhancement, or other significant abnormality." The pt was referred here and to neurology. The pt's appointment with neurology is scheduled in one month, for 01/23/2021.  The pt notes that he has never seen a neurologist in the past for his hx of migraines. The pt notes that he has been very healthy in the past with no hospitalizations, surgeries, or chronic medical issues. He denies any allergies or chronic use of medicine. The pt notes that he has been cycling through different antidepressants for around 2-3 months. The pt notes he previously tried Lexapro, Cirtraline, and Prolex. The pt is currently on   The pt notes that a few months ago he was first experiencing chronic neck pains in the left side. The pt was seen for this and received Naproxen. This resolved until the pt started experiencing chronic headaches that were on both sides of his head and concentrated on the back left side. The pt notes that sometimes  it felt as though he had a tight band on his head. The pt notes the headaches started after antidepressants, but had been on these medicines before with no issues. The pt notes he went to his PCP and they gave him Ubrelvy for one week. The pt notes this helped him, but he was instructed to stop by the neurologist in the ED. The pt notes he went to the ED that night due to the migraines causing nausea and uncontrollable shaking. The pt notes they were not concerned with seizures and the doctor told the pt that the pt's brain was trying to tell him something. The pt's wife notes some issues related to memory loss, mood swings, and paranoia. The pt notes he experiences intermittent issues with his right hand losing all sensation and movement. He notes this is an extreme weakness from the wrist up that lasts a few minutes prior to coming back. The pt notes no history of blood clots and no familial history. The pt notes his mom's first cousin recently passed away of a blood clot issue. The pt denies any close-knit family with issues related to blood clots.  The pt notes that recently he has also been experiencing dizziness and a feeling of imbalance with quick and sudden movements. The pt notes his head feels like a "fish bowl" and "wishy washy". The pt notes he jogged the other day across his house and felt that after he needed to sit down the rest of the day.  The pt notes that he experienced head trauma and syncope in the 4th grade due  to a fall on the playground. The pt notes that he had a workup done for this and CT and noted no issues related to this event.  The pt notes he currently sees Dr. Ludger Nutting, a psychologist in [place] for his depression. 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   Lab results 12/06/2020 of CBC w/diff and BMP is as follows: all values are WNL except  for Glucose of 107. 12/06/2020 Anticardiolipin IgG of <9, IgM of 13, IgA of <9. 12/06/2020 Prothrombin gene mutation not detected. 12/06/2020 Factor 5 leiden not detected. 12/06/2020 Beta-2 Glyco IgG / IgA / IgM of <9. 12/06/2020 Lupus Anticoagulant not detected. 12/06/2020 Protein S Total of 72, Activity of 61. 12/06/2020 Protein C Total of 135, Activity of 127. 12/06/2020 AntiThromb III Func of 123.  On review of systems, pt reports migraines, depression, decreased appetite, neck pain and denies fevers, chills, night sweats, sudden weight loss, leg swelling, changes in bowel habits, bug bites, severe dehydration, exotic travels, and any other symptoms.  INTERVAL HISTORY:  Jeremy Mcmahon is a wonderful 25 y.o. male who is here for f/u of cerebral venous thrombosis. The patient's last visit with [place] was on 12/14/2020. The pt reports that he is doing well overall.  The pt reports his headaches, "Brain fog" have completely resolved. He is following with neurology - Dr Frances Furbish and notes he still has a lot of question for neurology. He notes they discussed rpt brain imaging to evaluation of venous sinus clot resolution.  Lab results 03/09/2021 of CBC w/diff and CMP is as follows: all values are WNL. 03/09/2021 Anticardiolipin IgM of 18 - indeterminate. 03/09/2021 Protein S Panel WNL.  On review of systems, pt reports no other acute new symptoms. No issues with bleeding or inntolerance of anticoagulation.    MEDICAL HISTORY:  Past Medical History:  Diagnosis Date   Migraines     SURGICAL HISTORY: No past surgical history on file.  SOCIAL HISTORY: Social History   Socioeconomic History   Marital status: Single    Spouse name: Not on file   Number of children: Not on file   Years of education: Not on file   Highest education level: Not on file  Occupational History   Not on file  Tobacco Use   Smoking status: Never   Smokeless tobacco: Never  Vaping Use   Vaping Use: Never  used  Substance and Sexual Activity   Alcohol use: Yes   Drug use: Yes    Types: Marijuana   Sexual activity: Not on file  Other Topics Concern   Not on file  Social History Narrative   Not on file   Social Determinants of Health   Financial Resource Strain: Not on file  Food Insecurity: Not on file  Transportation Needs: Not on file  Physical Activity: Not on file  Stress: Not on file  Social Connections: Not on file  Intimate Partner Violence: Not on file    FAMILY HISTORY: No family history on file.  ALLERGIES:  is allergic to banana.  MEDICATIONS:  Current Outpatient Medications  Medication Sig Dispense Refill   Acetaminophen (TYLENOL 8 HOUR PO) Take by mouth.     buPROPion (WELLBUTRIN XL) 150 MG 24 hr tablet Take 1 tablet by mouth every morning.     gabapentin (NEURONTIN) 100 MG capsule Take 1 capsule (100 mg total)  by mouth at bedtime. 30 capsule 3   rivaroxaban (XARELTO) 20 MG TABS tablet Take 1 tablet (20 mg total) by mouth daily with supper. 30 tablet 2   tiZANidine (ZANAFLEX) 4 MG tablet Take 1 tablet (4 mg total) by mouth every 8 (eight) hours as needed for muscle spasms. 30 tablet 0   UBRELVY 100 MG TABS PLEASE SEE ATTACHED FOR DETAILED DIRECTIONS     No current facility-administered medications for this visit.    REVIEW OF SYSTEMS:   10 Point review of Systems was done is negative except as noted above.  PHYSICAL EXAMINATION: ECOG PERFORMANCE STATUS: 1 - Symptomatic but completely ambulatory  . Vitals:   03/16/21 1242  BP: 110/70  Pulse: 64  Resp: 18  Temp: 98.6 F (37 C)  SpO2: 100%    Filed Weights   03/16/21 1242  Weight: 144 lb 12.8 oz (65.7 kg)    .Body mass index is 19.1 kg/m.  NAD GENERAL:alert, in no acute distress and comfortable SKIN: no acute rashes, no significant lesions EYES: conjunctiva are pink and non-injected, sclera anicteric OROPHARYNX: MMM, no exudates, no oropharyngeal erythema or ulceration NECK: supple, no  JVD LYMPH:  no palpable lymphadenopathy in the cervical, axillary or inguinal regions LUNGS: clear to auscultation b/l with normal respiratory effort HEART: regular rate & rhythm ABDOMEN:  normoactive bowel sounds , non tender, not distended. Extremity: no pedal edema PSYCH: alert & oriented x 3 with fluent speech NEURO: no focal motor/sensory deficits  LABORATORY DATA:  I have reviewed the data as listed  . CBC Latest Ref Rng & Units 03/09/2021 12/06/2020  WBC 4.0 - 10.5 K/uL 5.5 7.2  Hemoglobin 13.0 - 17.0 g/dL 18.2 99.3  Hematocrit 71.6 - 52.0 % 39.1 44.6  Platelets 150 - 400 K/uL 198 294    . CMP Latest Ref Rng & Units 03/09/2021 12/06/2020  Glucose 70 - 99 mg/dL 95 967(E)  BUN 6 - 20 mg/dL 11 9  Creatinine 9.38 - 1.24 mg/dL 1.01 7.51  Sodium 025 - 145 mmol/L 139 139  Potassium 3.5 - 5.1 mmol/L 4.2 3.7  Chloride 98 - 111 mmol/L 106 105  CO2 22 - 32 mmol/L 27 23  Calcium 8.9 - 10.3 mg/dL 9.3 9.9  Total Protein 6.5 - 8.1 g/dL 7.2 -  Total Bilirubin 0.3 - 1.2 mg/dL 0.4 -  Alkaline Phos 38 - 126 U/L 65 -  AST 15 - 41 U/L 15 -  ALT 0 - 44 U/L 16 -   Component     Latest Ref Rng & Units 03/09/2021  Anticardiolipin Ab,IgG,Qn     0 - 14 GPL U/mL <9  Anticardiolipin Ab,IgM,Qn     0 - 12 MPL U/mL 18 (H)  Anticardiolipin Ab,IgA,Qn     0 - 11 APL U/mL <9  Protein S, Total     60 - 150 % 65  Protein S, Free     61 - 136 % 86  Protein S-Functional     63 - 140 % 86    RADIOGRAPHIC STUDIES: I have personally reviewed the radiological images as listed and agreed with the findings in the report. No results found.   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 PLAN: -Discussed pt recent labwork, protein S level WNL and cardiolipin ab are not significantly elevated at this time. -we discussed he does not have an obvious inherited or acquired obvious VTE risk factor at  this. -he notes he shall be  getting rpt brain imaging with his neurologist for re-evaluation of his venous sinus clot. -we would like toknow from neurology if they feel his neurology symptoms were related to venous sinus thrombosis and if the imaging was truly consistent with venous sinus thrombosis. -Continue Xarelto for now - based on neurology input and rpt imaging we shall determine transitioning to  ASA 81mg  po daily long term  FOLLOW UP: RTC with Dr in 4 months  Labs 1 week prior to clinic visit   All of the patients questions were answered with apparent satisfaction. The patient knows to call the clinic with any problems, questions or concerns.   The total time spent in the appointment was 20 minutes and more than 50% was on counseling and direct patient cares.    Candise Che MD MS AAHIVMS [facility] Hematology/Oncology Physician [facility]  (Office):       [phone] (Work cell):  [phone] (Fax):           [phone]  .No orders of the defined types were placed in this encounter.   I, [phone], am acting as scribe for Dr. Minda Meo, MD.  .I have reviewed the above documentation for accuracy and completeness, and I agree with the above. Wyvonnia Lora MD

## 2021-03-16 ENCOUNTER — Other Ambulatory Visit: Payer: Self-pay

## 2021-03-16 ENCOUNTER — Inpatient Hospital Stay: Payer: BC Managed Care – PPO | Attending: Hematology | Admitting: Hematology

## 2021-03-16 ENCOUNTER — Ambulatory Visit: Payer: BC Managed Care – PPO | Admitting: Hematology

## 2021-03-16 VITALS — BP 110/70 | HR 64 | Temp 98.6°F | Resp 18 | Wt 144.8 lb

## 2021-03-16 DIAGNOSIS — Z7901 Long term (current) use of anticoagulants: Secondary | ICD-10-CM | POA: Diagnosis not present

## 2021-03-16 DIAGNOSIS — I676 Nonpyogenic thrombosis of intracranial venous system: Secondary | ICD-10-CM | POA: Diagnosis not present

## 2021-03-16 DIAGNOSIS — Z79899 Other long term (current) drug therapy: Secondary | ICD-10-CM | POA: Insufficient documentation

## 2021-03-16 DIAGNOSIS — G08 Intracranial and intraspinal phlebitis and thrombophlebitis: Secondary | ICD-10-CM

## 2021-03-19 ENCOUNTER — Telehealth: Payer: Self-pay | Admitting: Hematology

## 2021-03-19 NOTE — Telephone Encounter (Signed)
Scheduled appts per 8/5 los. Called pt, no answer. Left msg with appts dates and times.  

## 2021-03-22 ENCOUNTER — Ambulatory Visit: Payer: BC Managed Care – PPO | Admitting: Family Medicine

## 2021-04-28 ENCOUNTER — Other Ambulatory Visit: Payer: Self-pay | Admitting: Hematology

## 2021-04-28 DIAGNOSIS — G08 Intracranial and intraspinal phlebitis and thrombophlebitis: Secondary | ICD-10-CM

## 2021-05-09 ENCOUNTER — Ambulatory Visit: Payer: BC Managed Care – PPO | Admitting: Neurology

## 2021-05-16 ENCOUNTER — Encounter: Payer: Self-pay | Admitting: Neurology

## 2021-05-29 ENCOUNTER — Telehealth: Payer: Self-pay | Admitting: *Deleted

## 2021-05-29 ENCOUNTER — Telehealth (INDEPENDENT_AMBULATORY_CARE_PROVIDER_SITE_OTHER): Payer: BC Managed Care – PPO | Admitting: Neurology

## 2021-05-29 ENCOUNTER — Encounter: Payer: Self-pay | Admitting: Neurology

## 2021-05-29 DIAGNOSIS — R519 Headache, unspecified: Secondary | ICD-10-CM

## 2021-05-29 DIAGNOSIS — G08 Intracranial and intraspinal phlebitis and thrombophlebitis: Secondary | ICD-10-CM | POA: Diagnosis not present

## 2021-05-29 NOTE — Telephone Encounter (Signed)
LVM asking pt to call us back to schedule 3 month f/u.

## 2021-05-29 NOTE — Patient Instructions (Signed)
Verbal instructions given

## 2021-05-29 NOTE — Telephone Encounter (Signed)
Patient had a VV today. Per Dr Lucia Gaskins, he will need to f/u in 3 months with an NP.

## 2021-05-29 NOTE — Progress Notes (Signed)
Interim history:   [name] is a 25 year old right-handed gentleman with an underlying medical history of migraine headaches, anxiety, depression, recent diagnosis of probable cerebral venous thrombosis, on Xarelto, who presents for a virtual visit via MyChart video visit follow-up consultation of his recurrent headaches.  The patient is unaccompanied today.  He is located at home on his laptop, I am located in my office at [facility].  I first met him at the request of his primary care nurse practitioner on 12/20/2020, at which time he reported a 21-monthhistory of recurrent headaches, which often started at the base of his head or in the neck area or in the bitemporal areas.  He had recurrent neck pain as well.  He was advised to start amitriptyline for headache prevention but due to a possible interaction between amitriptyline and Wellbutrin I changed his medication to gabapentin for headache prevention.  He was on Xarelto for a recent diagnosis of cerebral venous sinus thrombosis.  He has been followed by hematology.  I suggested we proceed with a cervical spine MRI.  He had a MRI of the cervical spine with and without contrast on 12/27/2020 and I reviewed the results: Impression: Normal MRI cervical spine with and without contrast.  We notified the patient of the test results.  Today, 05/29/2021: He reports doing okay, HAs were, in fact, improved through the summer, but he had some recurrence of headaches since earlier this month.  He is trying to hydrate well and rest well, in fact he sleeps quite well at this time.  He has not found any obvious triggers but does wonder if he has sinus related headaches and congestion or allergies or seasonal headaches.  He has had some lightheadedness when he changes positions quickly.  He has not had any sudden onset of one-sided weakness or numbness but has had bilateral hand tingling at times.  He has not actually tried the gabapentin as he  recalls.  He never picked it up as far as he knows.  He is willing to try the gabapentin.  He had a hard time finding an eye doctor but has an appointment this month.  He has to find a new primary care as the last practice is no longer in network.  He would like to get a repeat scan done to see if he still has evidence of any blood clots.  I explained to him that sometimes it is hard to tell for sure but a repeat scan would help to look at stability versus improvement versus any type of changes.  Previously:   12/20/20: (He) reports a 272-monthistory of recurrent headaches, headaches tend to be on the bitemporal areas or left parietal area, sometimes based on the neck and radiating upwards.  Sometimes the headache is throbbing.  He has had a longer standing history of neck pain of over 3 months duration.  He has not seen anybody for his neck pain but did go to urgent care in February.  He was given naproxen and was advised to do stretching exercises.  He has been tried on Ub[place]ut when he went to the emergency room he was advised to stop it.  He has tried over-the-counter medication for headaches.  He has some associated nausea and vomiting, occasional light sensitivity, occasional sound sensitivity and also reports history of vertigo. He works as a phCharity fundraiser He is a non-smoker and does not drink alcohol, has had marijuana before but not in the past  few months.  He drinks caffeine occasionally in the form of soda, not daily.  He tries to hydrate well.  He has no family history of migraines or clotting disorders with the exception of 1 first cousin of his mother, this cousin passed away from complications of a blood clot, he has no additional information.  I reviewed your office note from 12/01/2019.  He presented to the emergency room on 12/06/2020 with a 68-monthhistory of recurrent headaches.  He reported associated nausea and vomiting as well as lightheadedness.  He was on Ubrelvy at the time without  obvious improvement.  I reviewed the emergency room records.  He was treated symptomatically with dexamethasone, ketorolac, metoclopramide, ondansetron.  He had a CT head without contrast on 12/06/2020 and I reviewed the results:  IMPRESSION: No acute intracranial abnormality.  Normal examination.   He had a brain MRI with and without contrast on 12/06/2020 and I reviewed the results:  IMPRESSION: No intracranial mass, abnormal enhancement, or other significant abnormality.   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   Blood work included hypercoagulable labs, protein S activity with slightly decreased percentage at 61 with normal range at 63 to 140%.  Anticardiolipin IgM was elevated at about 13, Antithrombin III was 123% with normal range of 75 to 120%.  He reports loss of appetite in the past few months.  He has had some weight fluctuations, he is not sure what blood work additional blood work he has had through your office, no recent blood work was included in the office note from his visit from 11/30/2020.  For his anxiety and depression he has been on different medications in the past and recalls taking Lexapro, Prozac and sertraline.  For the past 2 weeks he has been on Wellbutrin, long-acting, 150 mg once daily.  He sees a pTeacher, music His Past Medical History Is Significant For: Past Medical History:  Diagnosis Date   Migraines     His Past Surgical History Is Significant For: No past surgical history on file.  His Family History Is Significant For: No family history on file.  His Social History Is Significant For: Social History   Socioeconomic History   Marital status: Single    Spouse name: Not on file   Number of children: Not on file   Years of education: Not on file   Highest education level: Not on file   Occupational History   Not on file  Tobacco Use   Smoking status: Never   Smokeless tobacco: Never  Vaping Use   Vaping Use: Never used  Substance and Sexual Activity   Alcohol use: Yes   Drug use: Yes    Types: Marijuana   Sexual activity: Not on file  Other Topics Concern   Not on file  Social History Narrative   Not on file   Social Determinants of Health   Financial Resource Strain: Not on file  Food Insecurity: Not on file  Transportation Needs: Not on file  Physical Activity: Not on file  Stress: Not on file  Social Connections: Not on file    His Allergies Are:  Allergies  Allergen Reactions   Banana Nausea And Vomiting  :   His Current Medications Are:  Outpatient Encounter Medications as of 05/29/2021  Medication Sig   Acetaminophen (TYLENOL 8 HOUR PO) Take by mouth.  buPROPion (WELLBUTRIN XL) 150 MG 24 hr tablet Take 1 tablet by mouth every morning.   gabapentin (NEURONTIN) 100 MG capsule Take 1 capsule (100 mg total) by mouth at bedtime. (Patient not taking: Reported on 03/16/2021)   tiZANidine (ZANAFLEX) 4 MG tablet Take 1 tablet (4 mg total) by mouth every 8 (eight) hours as needed for muscle spasms. (Patient not taking: Reported on 03/16/2021)   UBRELVY 100 MG TABS PLEASE SEE ATTACHED FOR DETAILED DIRECTIONS (Patient not taking: Reported on 03/16/2021)   XARELTO 20 MG TABS tablet TAKE 1 TABLET BY MOUTH DAILY WITH SUPPER.   No facility-administered encounter medications on file as of 05/29/2021.  :  Review of Systems:  Out of a complete 14 point review of systems, all are reviewed and negative with the exception of these symptoms as listed below:  Virtual Visit via Video Note on 05/29/2021:   I connected with Jeremy Mcmahon on 05/29/21 at  1:15 PM EDT by a video enabled telemedicine application and verified that I am speaking with the correct person using two identifiers.   I discussed the limitations of evaluation and management by telemedicine and the  availability of in person appointments. The patient expressed understanding and agreed to proceed.  History of Present Illness: See above.    Observations/Objective:  Pleasant, conversant, no acute distress.  Face is symmetric, no obvious facial asymmetry, speech is clear without hypophonia, voice tremor or difficulty speaking in full sentences, upper body movements symmetrical.  Assessment and Plan and Follow Up Instructions: In summary, Jeremy Mcmahon is a very pleasant 25 year old male with an underlying medical history of migraine headaches, anxiety, depression, recent diagnosis of probable cerebral venous thrombosis, on Xarelto, who presents for follow-up consultation of his recurrent headaches.  He was diagnosed with a spontaneous cerebral venous sinus thrombosis several months ago.  He has been on Xarelto and is followed by hematology.  No obvious genetic predisposition or hypercoagulable condition was found.  For headache prevention, he was supposed to start  gabapentin in May 2022.  He did not try it.  He is willing to try it at this time. He was also advised to seek a formal evaluation for his vision, as he had not seen an eye doctor in over 2 years.  He has an appointment pending for later this month.  We talked about headache triggers again today.  We will go ahead with a repeat CT venogram of his head at this time. His cervical spine MRI from 12/27/2020 was benign.    He is advised to see one of our nurse practitioners in 3 months in this office.  I answered all his questions today and he was in agreement with the plan.    I discussed the assessment and treatment plan with the patient. The patient was provided an opportunity to ask questions and all were answered. The patient agreed with the plan and demonstrated an understanding of the instructions.   The patient was advised to call back or seek an in-person evaluation if the symptoms worsen or if the condition fails to improve as  anticipated.   I provided 30 minutes of non-face-to-face time during this encounter.   Star Age, MD

## 2021-06-12 ENCOUNTER — Ambulatory Visit
Admission: RE | Admit: 2021-06-12 | Discharge: 2021-06-12 | Disposition: A | Discharge status: Home / Self Care | Payer: BC Managed Care – PPO | Source: Ambulatory Visit | Attending: Neurology | Admitting: Neurology

## 2021-06-12 DIAGNOSIS — R519 Headache, unspecified: Secondary | ICD-10-CM

## 2021-06-12 DIAGNOSIS — G08 Intracranial and intraspinal phlebitis and thrombophlebitis: Secondary | ICD-10-CM

## 2021-06-12 MED ORDER — IOPAMIDOL (ISOVUE-370) INJECTION 76%
75.0000 mL | Freq: Once | INTRAVENOUS | Status: AC | PRN
  Administered 2021-06-12: 75 mL via INTRAVENOUS

## 2021-06-13 ENCOUNTER — Telehealth: Payer: Self-pay | Admitting: *Deleted

## 2021-06-13 NOTE — Telephone Encounter (Signed)
-----   Message from Huston Foley, MD sent at 06/13/2021 10:04 AM EDT ----- Please call patient regarding his CT venogram of the head.  Please advise him that findings appear unchanged as compared to the April 2022 study, and now the impression is that the findings that were suspicious for a thrombosis could be from a physiologic i.e. normal variant appearance of the venous sinus rather than from an actual clot/thrombus.  Overall benign results and normal CT appearance of the brain.  I think it would be safe to discuss with the hematologist the transition from a blood thinner to aspirin at this point.  He can follow-up with our nurse practitioner as scheduled.  If he feels well in the upcoming few weeks, he can also push out the appointment to about 6 months from now, if he prefers

## 2021-06-13 NOTE — Telephone Encounter (Signed)
I called the patient and LVM (ok per DPR) advising patient of the results of his CT venogram of the head as noted in detail below by Dr. Frances Furbish.  Included in the message that Dr. Frances Furbish is okay with patient discussing with hematologist since transition from a blood thinner to aspirin at this time.  Also advised he can follow-up with our nurse practitioner as scheduled in January however if he feels well in the upcoming few weeks he is okay to push his appointment out 6 months from now if he prefers.  Left office number for call back.  Also advised I will send a message to his MyChart.  Also note the patient has viewed results on MyChart.

## 2021-07-09 ENCOUNTER — Telehealth: Payer: BC Managed Care – PPO | Admitting: Neurology

## 2021-07-18 ENCOUNTER — Other Ambulatory Visit: Payer: Self-pay

## 2021-07-18 DIAGNOSIS — G08 Intracranial and intraspinal phlebitis and thrombophlebitis: Secondary | ICD-10-CM

## 2021-07-19 ENCOUNTER — Other Ambulatory Visit: Payer: BC Managed Care – PPO

## 2021-07-26 ENCOUNTER — Ambulatory Visit: Payer: BC Managed Care – PPO | Admitting: Hematology

## 2021-07-26 ENCOUNTER — Telehealth: Payer: Self-pay | Admitting: Hematology

## 2021-07-26 NOTE — Telephone Encounter (Signed)
Scheduled per sch msg. Called and left msg  

## 2021-07-28 ENCOUNTER — Other Ambulatory Visit: Payer: Self-pay | Admitting: Hematology

## 2021-07-28 DIAGNOSIS — G08 Intracranial and intraspinal phlebitis and thrombophlebitis: Secondary | ICD-10-CM

## 2021-08-10 ENCOUNTER — Inpatient Hospital Stay: Payer: BC Managed Care – PPO | Attending: Hematology

## 2021-08-10 ENCOUNTER — Other Ambulatory Visit: Payer: Self-pay

## 2021-08-10 ENCOUNTER — Inpatient Hospital Stay: Payer: BC Managed Care – PPO | Admitting: Hematology

## 2021-08-10 ENCOUNTER — Encounter: Payer: Self-pay | Admitting: Hematology

## 2021-08-10 DIAGNOSIS — G08 Intracranial and intraspinal phlebitis and thrombophlebitis: Secondary | ICD-10-CM | POA: Insufficient documentation

## 2021-08-10 LAB — CMP (CANCER CENTER ONLY)
ALT: 11 U/L (ref 0–44)
AST: 13 U/L — ABNORMAL LOW (ref 15–41)
Albumin: 4.8 g/dL (ref 3.5–5.0)
Alkaline Phosphatase: 45 U/L (ref 38–126)
Anion gap: 6 (ref 5–15)
BUN: 13 mg/dL (ref 6–20)
CO2: 29 mmol/L (ref 22–32)
Calcium: 9.5 mg/dL (ref 8.9–10.3)
Chloride: 104 mmol/L (ref 98–111)
Creatinine: 0.89 mg/dL (ref 0.61–1.24)
GFR, Estimated: >60 mL/min (ref >60)
Glucose, Bld: 82 mg/dL (ref 70–99)
Potassium: 4.1 mmol/L (ref 3.5–5.1)
Sodium: 139 mmol/L (ref 135–145)
Total Bilirubin: 0.4 mg/dL (ref 0.3–1.2)
Total Protein: 7.3 g/dL (ref 6.5–8.1)

## 2021-08-10 LAB — CBC WITH DIFFERENTIAL (CANCER CENTER ONLY)
Abs Immature Granulocytes: 0.02 10*3/uL (ref 0.00–0.07)
Basophils Absolute: 0 10*3/uL (ref 0.0–0.1)
Basophils Relative: 0 %
Eosinophils Absolute: 0.2 10*3/uL (ref 0.0–0.5)
Eosinophils Relative: 2 %
HCT: 39.7 % (ref 39.0–52.0)
Hemoglobin: 14 g/dL (ref 13.0–17.0)
Immature Granulocytes: 0 %
Lymphocytes Relative: 56 %
Lymphs Abs: 3.9 10*3/uL (ref 0.7–4.0)
MCH: 28.5 pg (ref 26.0–34.0)
MCHC: 35.3 g/dL (ref 30.0–36.0)
MCV: 80.7 fL (ref 80.0–100.0)
Monocytes Absolute: 0.6 10*3/uL (ref 0.1–1.0)
Monocytes Relative: 9 %
Neutro Abs: 2.4 10*3/uL (ref 1.7–7.7)
Neutrophils Relative %: 33 %
Platelet Count: 241 10*3/uL (ref 150–400)
RBC: 4.92 MIL/uL (ref 4.22–5.81)
RDW: 12.9 % (ref 11.5–15.5)
WBC Count: 7.1 10*3/uL (ref 4.0–10.5)
nRBC: 0 % (ref 0.0–0.2)

## 2021-08-11 LAB — PROTEIN S PANEL
Protein S Activity: 73 % (ref 63–140)
Protein S Ag, Free: 81 % (ref 61–136)
Protein S Ag, Total: 62 % (ref 60–150)

## 2021-08-12 ENCOUNTER — Encounter: Payer: Self-pay | Admitting: Hematology

## 2021-08-12 LAB — CARDIOLIPIN ANTIBODIES, IGG, IGM, IGA
Anticardiolipin IgA: <9 APL U/mL (ref 0–11)
Anticardiolipin IgG: <9 GPL U/mL (ref 0–14)
Anticardiolipin IgM: 15 MPL U/mL — ABNORMAL HIGH (ref 0–12)

## 2021-08-21 ENCOUNTER — Inpatient Hospital Stay: Payer: BC Managed Care – PPO | Attending: Hematology | Admitting: Hematology

## 2021-08-21 DIAGNOSIS — Z79899 Other long term (current) drug therapy: Secondary | ICD-10-CM | POA: Insufficient documentation

## 2021-08-21 DIAGNOSIS — Z7901 Long term (current) use of anticoagulants: Secondary | ICD-10-CM | POA: Insufficient documentation

## 2021-08-21 DIAGNOSIS — F1290 Cannabis use, unspecified, uncomplicated: Secondary | ICD-10-CM | POA: Insufficient documentation

## 2021-08-21 DIAGNOSIS — G43909 Migraine, unspecified, not intractable, without status migrainosus: Secondary | ICD-10-CM | POA: Diagnosis not present

## 2021-08-21 DIAGNOSIS — G08 Intracranial and intraspinal phlebitis and thrombophlebitis: Secondary | ICD-10-CM | POA: Diagnosis not present

## 2021-08-27 NOTE — Progress Notes (Addendum)
HEMATOLOGY/ONCOLOGY PHONE VISIT NOTE  Date of Service: .08/21/2021   Patient Care Team: Jeremy Mcmahon, Micah, FNP as PCP - General (Family Medicine)  CHIEF COMPLAINTS/PURPOSE OF CONSULTATION:  Follow-up for possible protein S deficiency and indeterminate anticardiolipin antibody in the context of possible cerebral venous sinus thrombosis  HISTORY OF PRESENTING ILLNESS:   Jeremy Mcmahon is a wonderful 26 y.o. male who has been referred to us by [facility] ED for evaluation and management of cerebral venous thrombosis. The pt reports that he is doing well overall. We are joined today by the pt's wife.  The pt presented in the ED on 12/06/2020 with complaints of daily migraines that have been ongoing for two months and associated nausea, vomiting, dizziness, and lightheadedness. The pt had a CT Head wo contrast that revealed "No acute intracranial abnormality.  Normal examination" . The pt also had a CT venogram head that revealed "Mild, linear, nonocclusive filling defects within a non-expanded superior sagittal sinus, possibly chronic thrombus." The pt had a MRI Brain that revealed  "No intracranial mass, abnormal enhancement, or other significant abnormality." The pt was referred here and to neurology. The pt's appointment with neurology is scheduled in one month, for 01/23/2021.  The pt notes that he has never seen a neurologist in the past for his hx of migraines. The pt notes that he has been very healthy in the past with no hospitalizations, surgeries, or chronic medical issues. He denies any allergies or chronic use of medicine. The pt notes that he has been cycling through different antidepressants for around 2-3 months. The pt notes he previously tried Lexapro, Cirtraline, and Prolex. The pt is currently on   The pt notes that a few months ago he was first experiencing chronic neck pains in the left side. The pt was seen for this and received Naproxen. This resolved until the pt started  experiencing chronic headaches that were on both sides of his head and concentrated on the back left side. The pt notes that sometimes it felt as though he had a tight band on his head. The pt notes the headaches started after antidepressants, but had been on these medicines before with no issues. The pt notes he went to his PCP and they gave him Ubrelvy for one week. The pt notes this helped him, but he was instructed to stop by the neurologist in the ED. The pt notes he went to the ED that night due to the migraines causing nausea and uncontrollable shaking. The pt notes they were not concerned with seizures and the doctor told the pt that the pt's brain was trying to tell him something. The pt's wife notes some issues related to memory loss, mood swings, and paranoia. The pt notes he experiences intermittent issues with his right hand losing all sensation and movement. He notes this is an extreme weakness from the wrist up that lasts a few minutes prior to coming back. The pt notes no history of blood clots and no familial history. The pt notes his mom's first cousin recently passed away of a blood clot issue. The pt denies any close-knit family with issues related to blood clots.  The pt notes that recently he has also been experiencing dizziness and a feeling of imbalance with quick and sudden movements. The pt notes his head feels like a "fish bowl" and "wishy washy". The pt notes he jogged the other day across his house and felt that after he needed to sit down the rest of  the day.  The pt notes that he experienced head trauma and syncope in the 4th grade due to a fall on the playground. The pt notes that he had a workup done for this and CT and noted no issues related to this event.  The pt notes he currently sees Dr. Marcelino Mcmahon, a psychologist in [place] for his depression. 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   Lab results 12/06/2020 of CBC w/diff and BMP is as follows: all values are WNL except for Glucose of 107. 12/06/2020 Anticardiolipin IgG of <9, IgM of 13, IgA of <9. 12/06/2020 Prothrombin gene mutation not detected. 12/06/2020 Factor 5 leiden not detected. 12/06/2020 Beta-2 Glyco IgG / IgA / IgM of <9. 12/06/2020 Lupus Anticoagulant not detected. 12/06/2020 Protein S Total of 72, Activity of 61. 12/06/2020 Protein C Total of 135, Activity of 127. 12/06/2020 AntiThromb III Func of 123.  On review of systems, pt reports migraines, depression, decreased appetite, neck pain and denies fevers, chills, night sweats, sudden weight loss, leg swelling, changes in bowel habits, bug bites, severe dehydration, exotic travels, and any other symptoms.  INTERVAL HISTORY:  .I connected with Jeremy Mcmahon on 08/28/21 at  9:20 AM EST by telephone visit and verified that I am speaking with the correct person using two identifiers.   I discussed the limitations, risks, security and privacy concerns of performing an evaluation and management service by telemedicine and the availability of in-person appointments. I also discussed with the patient that there may be a patient responsible charge related to this service. The patient expressed understanding and agreed to proceed.   Other persons participating in the visit and their role in the encounter: None  Patients location: Home Providers location: [place]  Chief Complaint: Review of labs and follow-up for discussion of anticoagulation recommendations.  [name] was called to follow-up on his labs and to discuss anticoagulation recommendations. Since his last clinic visit with [place] he notes he has been feeling well and has had no significant headaches or new focal neurological deficits.  Has been busy at work.  He had a follow-up with his neurologist Dr. Star Mcmahon and had repeat CT  venogram of the head on 06/12/2021 which showed "Patent superior sagittal sinus with unchanged enhancement of the sinus since April. Suspected occasional arachnoid spaces and arachnoid granulations within the sinus (physiologic) rather than nonocclusive thrombus. And otherwise negative CTV, no evidence of dural venous sinus thrombosis. Normal CT appearance of the brain. No acute intracranial abnormality".  His labs from 08/10/2021 showed normal CBC, unremarkable CMP, normal protein S antigen of 62% and normal protein S activity of 73%.  Anticardiolipin antibodies showed indeterminate anticardiolipin IgM at titers of 15 which are lower than 18 they were 5 months ago. We discussed that his work-up does not show any overt hypercoagulable state and his CT venogram seems to suggest that he may never have had a nonocclusive cerebral venous sinus thrombus and that the findings probably represented arachnoid spaces and arachnoid granulations within the sinus.  We also discussed that this needs to be interpreted and discussed with his neurologist.  He notes no other acute new symptoms.  MEDICAL HISTORY:  Past Medical History:  Diagnosis Date   Migraines     SURGICAL HISTORY: No past surgical history on file.  SOCIAL HISTORY: Social History  Socioeconomic History   Marital status: Single    Spouse name: Not on file   Number of children: Not on file   Years of education: Not on file   Highest education level: Not on file  Occupational History   Not on file  Tobacco Use   Smoking status: Never   Smokeless tobacco: Never  Vaping Use   Vaping Use: Never used  Substance and Sexual Activity   Alcohol use: Yes   Drug use: Yes    Types: Marijuana   Sexual activity: Not on file  Other Topics Concern   Not on file  Social History Narrative   Not on file   Social Determinants of Health   Financial Resource Strain: Not on file  Food Insecurity: Not on file  Transportation Needs: Not on file   Physical Activity: Not on file  Stress: Not on file  Social Connections: Not on file  Intimate Partner Violence: Not on file    FAMILY HISTORY: No family history on file.  ALLERGIES:  is allergic to banana.  MEDICATIONS:  Current Outpatient Medications  Medication Sig Dispense Refill   Acetaminophen (TYLENOL 8 HOUR PO) Take by mouth.     buPROPion (WELLBUTRIN XL) 150 MG 24 hr tablet Take 1 tablet by mouth every morning.     gabapentin (NEURONTIN) 100 MG capsule Take 1 capsule (100 mg total) by mouth at bedtime. (Patient not taking: Reported on 03/16/2021) 30 capsule 3   tiZANidine (ZANAFLEX) 4 MG tablet Take 1 tablet (4 mg total) by mouth every 8 (eight) hours as needed for muscle spasms. (Patient not taking: Reported on 03/16/2021) 30 tablet 0   UBRELVY 100 MG TABS PLEASE SEE ATTACHED FOR DETAILED DIRECTIONS (Patient not taking: Reported on 03/16/2021)     XARELTO 20 MG TABS tablet TAKE 1 TABLET BY MOUTH DAILY WITH SUPPER 30 tablet 2   No current facility-administered medications for this visit.    REVIEW OF SYSTEMS:   10 Point review of Systems was done is negative except as noted above.  PHYSICAL EXAMINATION: ECOG PERFORMANCE STATUS: 1 - Symptomatic but completely ambulatory  . There were no vitals filed for this visit.   There were no vitals filed for this visit.   .There is no height or weight on file to calculate BMI.  NAD GENERAL:alert, in no acute distress and comfortable SKIN: no acute rashes, no significant lesions EYES: conjunctiva are pink and non-injected, sclera anicteric OROPHARYNX: MMM, no exudates, no oropharyngeal erythema or ulceration NECK: supple, no JVD LYMPH:  no palpable lymphadenopathy in the cervical, axillary or inguinal regions LUNGS: clear to auscultation b/l with normal respiratory effort HEART: regular rate & rhythm ABDOMEN:  normoactive bowel sounds , non tender, not distended. Extremity: no pedal edema PSYCH: alert & oriented x 3 with  fluent speech NEURO: no focal motor/sensory deficits  LABORATORY DATA:  I have reviewed the data as listed  . CBC Latest Ref Rng & Units 08/10/2021 03/09/2021 12/06/2020  WBC 4.0 - 10.5 K/uL 7.1 5.5 7.2  Hemoglobin 13.0 - 17.0 g/dL 14.0 13.9 15.4  Hematocrit 39.0 - 52.0 % 39.7 39.1 44.6  Platelets 150 - 400 K/uL 241 198 294    . CMP Latest Ref Rng & Units 08/10/2021 03/09/2021 12/06/2020  Glucose 70 - 99 mg/dL 82 95 107(H)  BUN 6 - 20 mg/dL 13 11 9   Creatinine 0.61 - 1.24 mg/dL 0.89 0.95 0.84  Sodium 135 - 145 mmol/L 139 139 139  Potassium 3.5 - 5.1  mmol/L 4.1 4.2 3.7  Chloride 98 - 111 mmol/L 104 106 105  CO2 22 - 32 mmol/L 29 27 23   Calcium 8.9 - 10.3 mg/dL 9.5 9.3 9.9  Total Protein 6.5 - 8.1 g/dL 7.3 7.2 -  Total Bilirubin 0.3 - 1.2 mg/dL 0.4 0.4 -  Alkaline Phos 38 - 126 U/L 45 65 -  AST 15 - 41 U/L 13(L) 15 -  ALT 0 - 44 U/L 11 16 -   Component     Latest Ref Rng & Units 03/09/2021  Anticardiolipin Ab,IgG,Qn     0 - 14 GPL U/mL <9  Anticardiolipin Ab,IgM,Qn     0 - 12 MPL U/mL 18 (H)  Anticardiolipin Ab,IgA,Qn     0 - 11 APL U/mL <9  Protein S, Total     60 - 150 % 65  Protein S, Free     61 - 136 % 86  Protein S-Functional     63 - 140 % 86   Component     Latest Ref Rng & Units 08/10/2021  Anticardiolipin Ab,IgG,Qn     0 - 14 GPL U/mL <9  Anticardiolipin Ab,IgM,Qn     0 - 12 MPL U/mL 15 (H)  Anticardiolipin Ab,IgA,Qn     0 - 11 APL U/mL <9  Protein S, Total     60 - 150 % 62  Protein S, Free     61 - 136 % 81  Protein S-Functional     63 - 140 % 73   RADIOGRAPHIC STUDIES: I have personally reviewed the radiological images as listed and agreed with the findings in the report. No results found.   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 Repeat CT venogram 06/12/2021 suggests that there was not a nonocclusive thrombus but rather arachnoid spaces and arachnoid  granulations within the dural venous sinus.  PLAN: -Since her last clinic visit the patient had a follow-up with his neurologist Dr. Star Mcmahon and had repeat CT venogram of the head on 06/12/2021 which showed "Patent superior sagittal sinus with unchanged enhancement of the sinus since April. Suspected occasional arachnoid spaces and arachnoid granulations within the sinus (physiologic) rather than nonocclusive thrombus. And otherwise negative CTV, no evidence of dural venous sinus thrombosis. Normal CT appearance of the brain. No acute intracranial abnormality".  His labs from 08/10/2021 showed normal CBC, unremarkable CMP, normal protein S antigen of 62% and normal protein S activity of 73%.  Anticardiolipin antibodies showed indeterminate anticardiolipin IgM at titers of 15 which are lower than 18 they were 5 months ago. We discussed that his work-up does not show any overt hypercoagulable state and his CT venogram seems to suggest that he may never have had a nonocclusive cerebral venous sinus thrombus and that the findings probably represented arachnoid spaces and arachnoid granulations within the sinus.   -We also discussed that this needs to be interpreted and discussed with his neurologist. -If Dr. Rexene Alberts concurs that the patient never had a cerebral venous sinus thrombus then there is no indication for continued anticoagulation at this time and he can stop his Xarelto. -If there is uncertainty about the presence of possible nonocclusive cerebral venous sinus thrombosis he can switch to aspirin 81 mg p.o. daily long-term after discussion with his neurologist.  FOLLOW UP:  Continue follow-up with primary care physician and neurology Return to clinic with Dr. Irene Limbo as needed  All of the patients  questions were answered with apparent satisfaction. The patient knows to call the clinic with any problems, questions or concerns.  Sullivan Lone MD [place] AAHIVMS [facility] Hematology/Oncology  Physician [facility]

## 2021-09-10 ENCOUNTER — Encounter: Payer: Self-pay | Admitting: Adult Health

## 2021-09-10 ENCOUNTER — Ambulatory Visit: Payer: BC Managed Care – PPO | Admitting: Adult Health

## 2021-09-25 ENCOUNTER — Ambulatory Visit: Payer: BC Managed Care – PPO | Admitting: Neurology

## 2022-04-10 IMAGING — CT CT HEAD W/O CM
4 series · 16 of 47 positions shown, 18 images · non-contrast
Comparison: None.

CLINICAL DATA: Headache

EXAM:
CT HEAD WITHOUT CONTRAST
TECHNIQUE: Contiguous axial images were obtained from the base of the skull
through the vertex without intravenous contrast.

[Series 3: head without · axial · non-contrast · 0.43mm/px · z∈[-90,+35]mm · 7 of 35 slices shown, 9 images]
[im 5/35  brain]
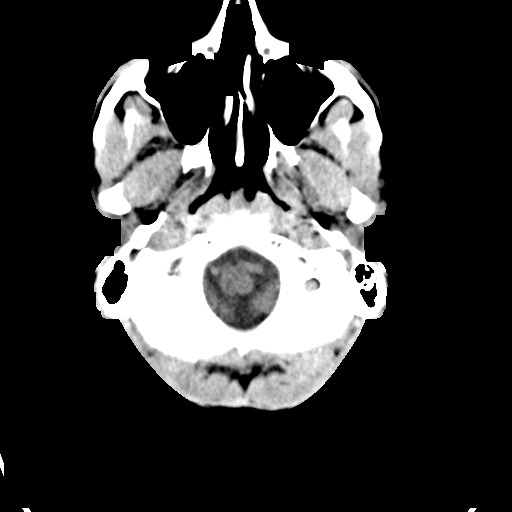
[im 5/35  bone]
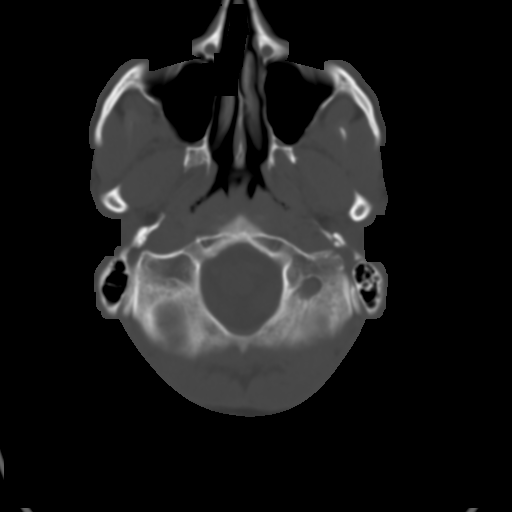
[im 9/35  brain]
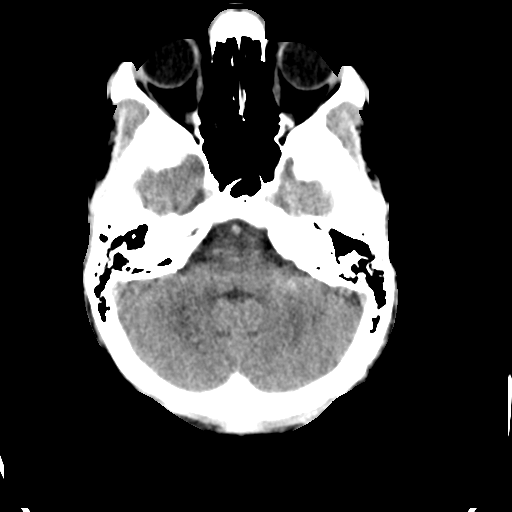
[im 13/35  brain]
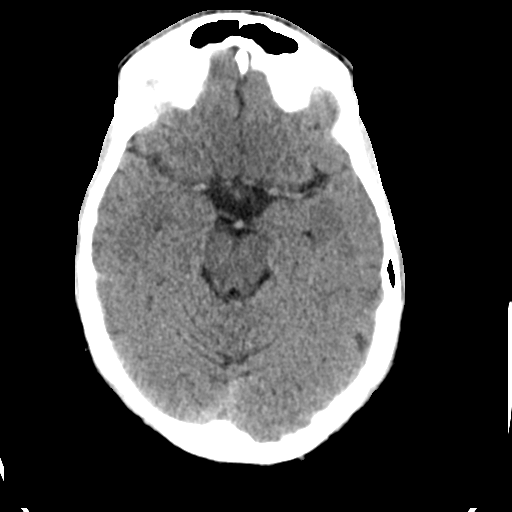
[im 18/35  brain]
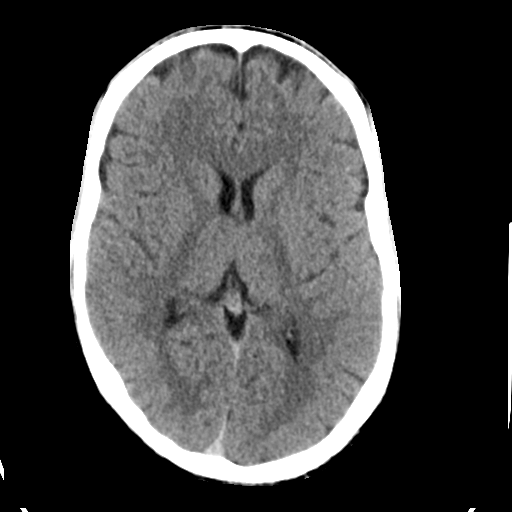
[im 22/35  brain]
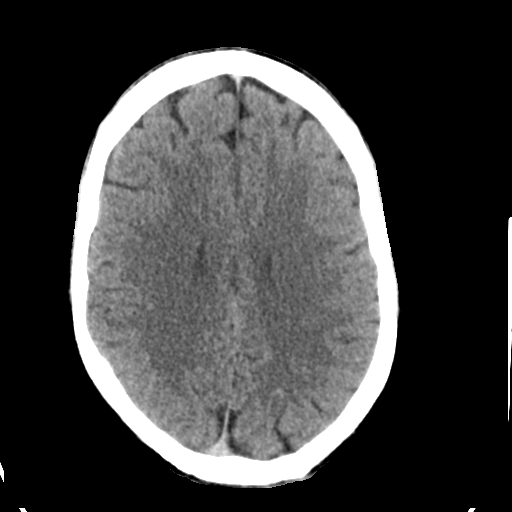
[im 22/35  bone]
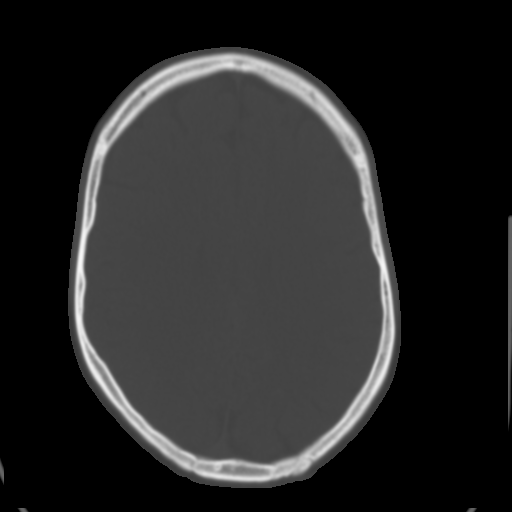
[im 26/35  brain]
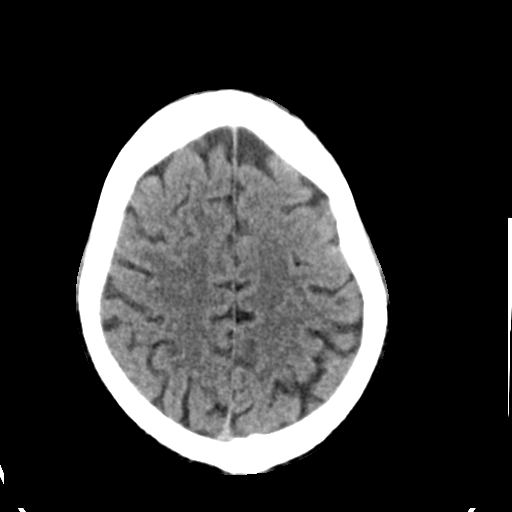
[im 30/35  brain]
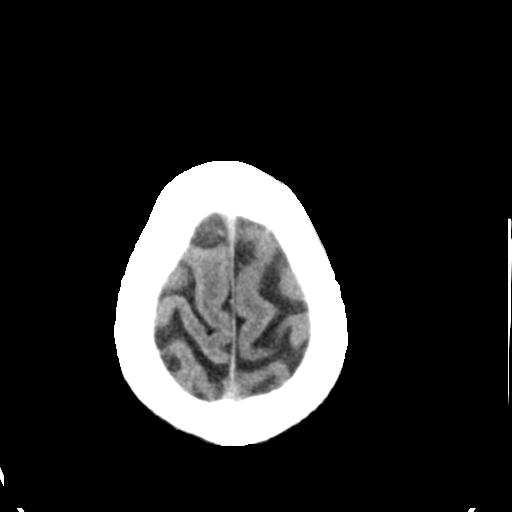

[Series 4: head bone · axial · 0.43mm/px · z∈[-94,-60]mm · 3 of 87 slices shown]
[im 9/87  bone]
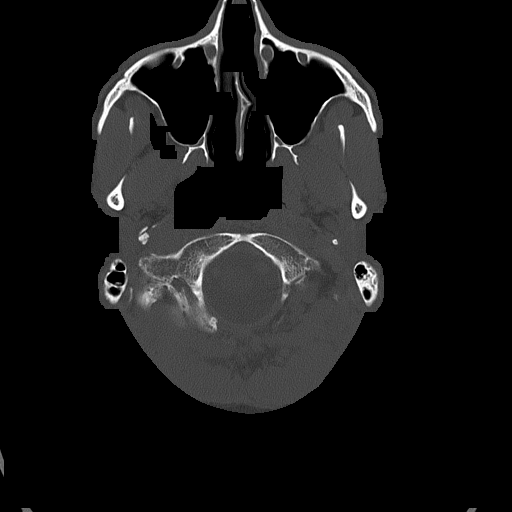
[im 18/87  bone]
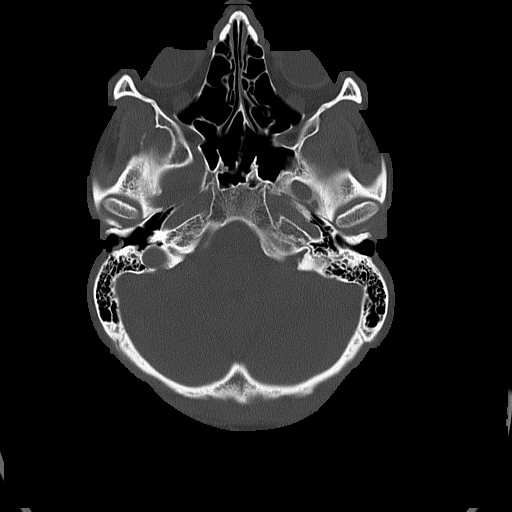
[im 26/87  bone]
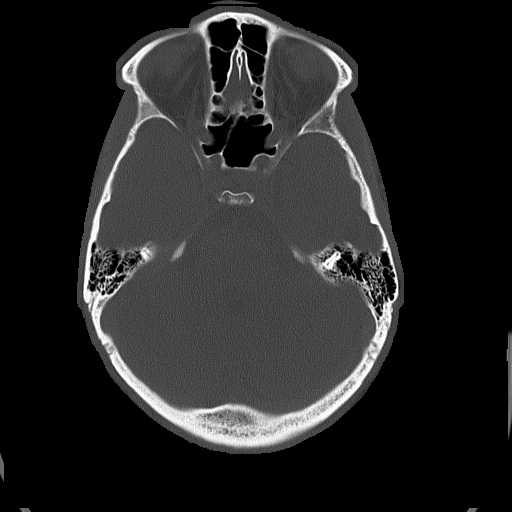

[Series 5: head without cor · coronal · non-contrast · 0.34mm/px · 3 of 74 slices shown]
[im 25/74  brain]
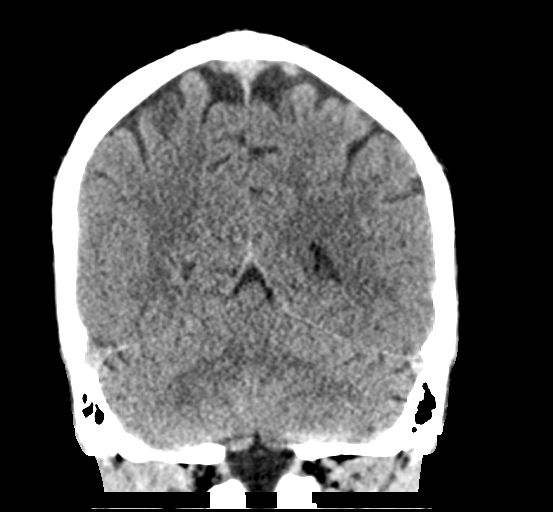
[im 33/74  brain]
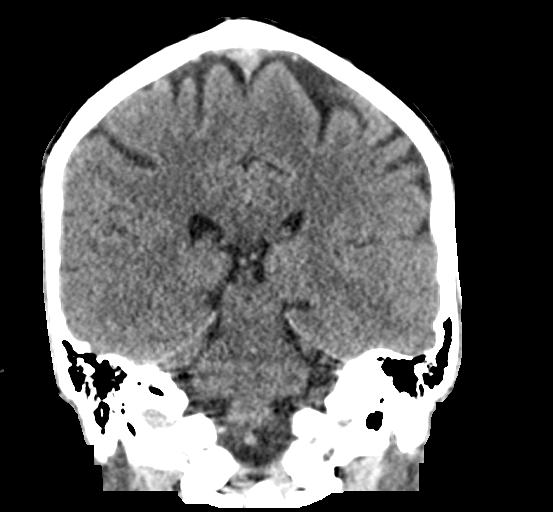
[im 41/74  brain]
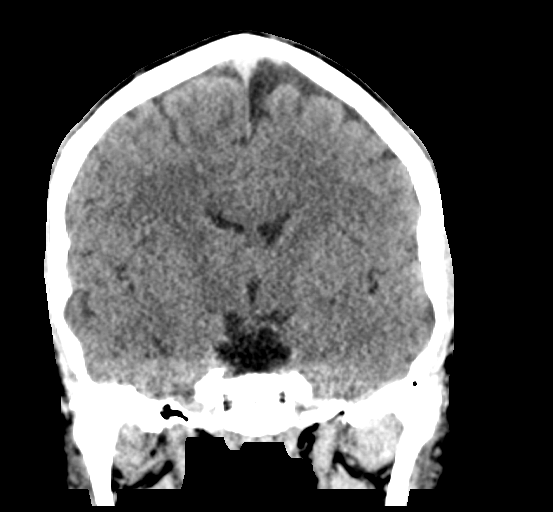

[Series 6: head without sag · sagittal · non-contrast · 0.34mm/px · 3 of 57 slices shown]
[im 19/57  brain]
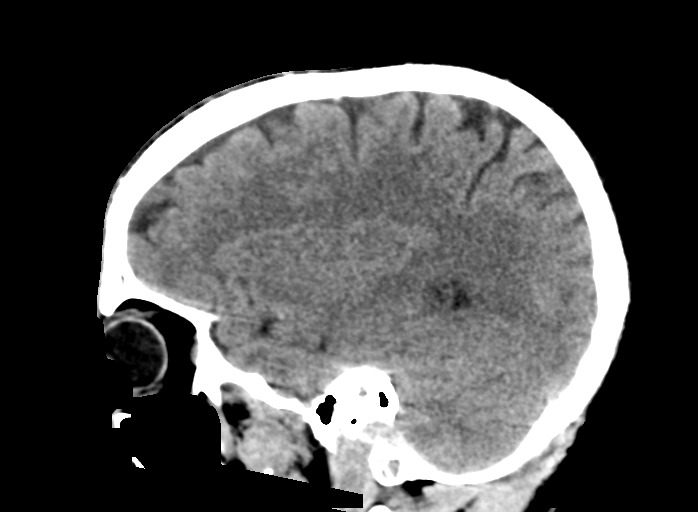
[im 29/57  brain]
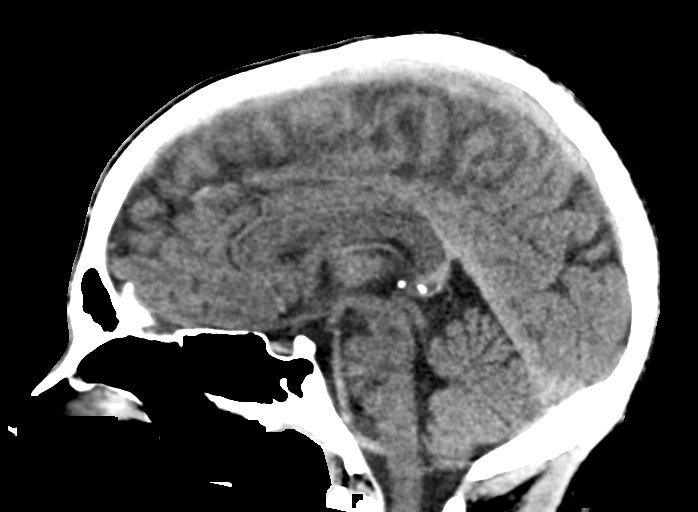
[im 38/57  brain]
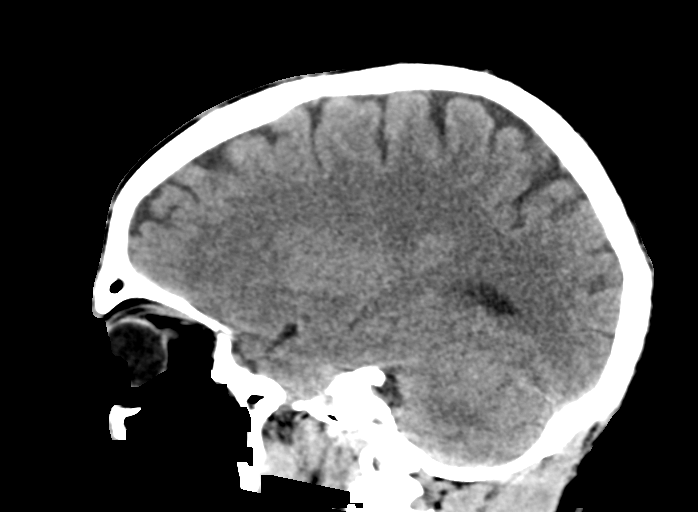

[16 of 47 positions shown; findings below may reference images not displayed]

FINDINGS: Brain: Normal anatomic configuration. No abnormal intra or
extra-axial mass lesion or fluid collection. No abnormal mass effect
or midline shift. No evidence of acute intracranial hemorrhage or
infarct. Ventricular size is normal. Cerebellum unremarkable.

Vascular: Unremarkable

Skull: Intact

Sinuses/Orbits: Paranasal sinuses are clear. Orbits are
unremarkable.

Other: Mastoid air cells and middle ear cavities are clear.
IMPRESSION: No acute intracranial abnormality.  Normal examination.

## 2022-05-09 ENCOUNTER — Encounter: Payer: Self-pay | Admitting: Hematology

## 2022-05-10 ENCOUNTER — Encounter: Payer: Self-pay | Admitting: Neurology

## 2022-07-03 ENCOUNTER — Other Ambulatory Visit: Payer: Self-pay | Admitting: Family Medicine

## 2022-07-03 DIAGNOSIS — N50819 Testicular pain, unspecified: Secondary | ICD-10-CM

## 2022-07-19 ENCOUNTER — Ambulatory Visit
Admission: RE | Admit: 2022-07-19 | Discharge: 2022-07-19 | Disposition: A | Discharge status: Home / Self Care | Payer: BC Managed Care – PPO | Source: Ambulatory Visit | Attending: Family Medicine | Admitting: Family Medicine

## 2022-07-19 DIAGNOSIS — N50819 Testicular pain, unspecified: Secondary | ICD-10-CM
